# Patient Record
Sex: Female | Born: 1955 | Race: White | Hispanic: No | Marital: Married | Smoking: Never smoker
Health system: Southern US, Community
[De-identification: ages and names within clinical notes are randomized; demographics above are authoritative.]

## PROBLEM LIST (undated history)

## (undated) DIAGNOSIS — Z9889 Other specified postprocedural states: Secondary | ICD-10-CM

## (undated) DIAGNOSIS — T753XXA Motion sickness, initial encounter: Secondary | ICD-10-CM

## (undated) DIAGNOSIS — R112 Nausea with vomiting, unspecified: Secondary | ICD-10-CM

## (undated) DIAGNOSIS — F329 Major depressive disorder, single episode, unspecified: Secondary | ICD-10-CM

## (undated) DIAGNOSIS — M1990 Unspecified osteoarthritis, unspecified site: Secondary | ICD-10-CM

## (undated) DIAGNOSIS — I10 Essential (primary) hypertension: Secondary | ICD-10-CM

## (undated) DIAGNOSIS — K219 Gastro-esophageal reflux disease without esophagitis: Secondary | ICD-10-CM

## (undated) DIAGNOSIS — F32A Depression, unspecified: Secondary | ICD-10-CM

## (undated) DIAGNOSIS — K2270 Barrett's esophagus without dysplasia: Secondary | ICD-10-CM

## (undated) HISTORY — DX: Essential (primary) hypertension: I10

## (undated) HISTORY — DX: Depression, unspecified: F32.A

## (undated) HISTORY — DX: Major depressive disorder, single episode, unspecified: F32.9

## (undated) HISTORY — PX: FRACTURE SURGERY: SHX138

## (undated) HISTORY — PX: WISDOM TOOTH EXTRACTION: SHX21

## (undated) HISTORY — DX: Barrett's esophagus without dysplasia: K22.70

## (undated) HISTORY — PX: COLONOSCOPY: SHX174

## (undated) HISTORY — PX: BREAST BIOPSY: SHX20

## (undated) HISTORY — PX: FOOT SURGERY: SHX648

## (undated) HISTORY — PX: BREAST EXCISIONAL BIOPSY: SUR124

---

## 2005-04-28 ENCOUNTER — Ambulatory Visit: Payer: Self-pay | Admitting: Obstetrics and Gynecology

## 2005-07-17 ENCOUNTER — Emergency Department: Payer: Self-pay | Admitting: Emergency Medicine

## 2005-07-24 ENCOUNTER — Emergency Department: Payer: Self-pay | Admitting: Emergency Medicine

## 2007-11-22 ENCOUNTER — Ambulatory Visit: Payer: Self-pay | Admitting: Radiology

## 2007-11-23 ENCOUNTER — Ambulatory Visit: Payer: Self-pay | Admitting: Ophthalmology

## 2008-03-30 ENCOUNTER — Ambulatory Visit: Payer: Self-pay | Admitting: Occupational Medicine

## 2008-07-09 ENCOUNTER — Ambulatory Visit: Payer: Self-pay | Admitting: Unknown Physician Specialty

## 2011-06-10 ENCOUNTER — Ambulatory Visit: Payer: Self-pay | Admitting: Internal Medicine

## 2011-12-09 ENCOUNTER — Ambulatory Visit: Payer: Self-pay | Admitting: Obstetrics and Gynecology

## 2011-12-18 ENCOUNTER — Ambulatory Visit: Payer: Self-pay | Admitting: Obstetrics and Gynecology

## 2011-12-22 LAB — PATHOLOGY REPORT

## 2012-09-26 ENCOUNTER — Ambulatory Visit: Payer: Self-pay | Admitting: Gastroenterology

## 2012-09-27 LAB — PATHOLOGY REPORT

## 2013-05-02 ENCOUNTER — Ambulatory Visit: Payer: Self-pay | Admitting: Internal Medicine

## 2014-01-12 ENCOUNTER — Ambulatory Visit: Payer: Self-pay | Admitting: Gastroenterology

## 2014-01-17 LAB — PATHOLOGY REPORT

## 2014-09-11 NOTE — Op Note (Signed)
PATIENT NAME:  Andrea Livingston, Andrea Livingston MR#:  [id number] DATE OF BIRTH:  [birth year]  DATE OF PROCEDURE:  12/18/2011  PREOPERATIVE DIAGNOSIS: Postmenopausal bleeding.   POSTOPERATIVE DIAGNOSES:  1. Postmenopausal bleeding.  2. Endometrial polyp.   PROCEDURES:  1. Fractional dilation and curettage.  2. Resectoscopic removal of endometrial polyp.   SURGEON:  Beverly Gust. Martise Waddell, MD  ANESTHESIA: General endotracheal anesthesia.   INDICATIONS: A [age] year old gravida 3, para 3 patient with postmenopausal bleeding. The patient is with a thickened endometrial stripe on vaginal ultrasound.   PROCEDURE: After adequate general endotracheal anesthesia, the patient was placed in the dorsal supine position with the legs in the candy-cane stirrups. Lower abdomen, perineum, and vagina were prepped with Betadine and the patient was sterilely draped. Straight catheterization of the bladder yielded 100 mL urine. A weighted speculum was placed in the posterior vaginal vault and the a single-tooth tenaculum used to hold the anterior cervix.   Endocervical curettage was performed. Specimens sent to pathology. Uterus was sounded to 8 cm. Then the cervix was dilated to #18 Hanks dilator without difficulty. The hysteroscope was advanced with lactated Ringer's as the distending medium and was advanced into the endometrial cavity. Of note, there was a large, approximately 2 x 2-centimeter, anterior fundal, broad-based polyp. The hysteroscope was removed and the resectoscope was then assembled. The distending medium was switched to 1.5% glycine. The hysteroscope was advanced into the endometrial cavity and the endometrial polyp was dissected in fragments. Of note, there was a fluffy, thickened endometrial lining throughout the cavity. Intraoperative picture was taken. Good hemostasis was noted. Sharp curettage was then performed with additional fragments of tissue, again good hemostasis was noted. The patient tolerated the procedure  well. Silver nitrate applied to the anterior tenacula sites. Estimated blood loss minimal. Intraoperative fluids 1000 mL. Urine output 100 mL.  650 mL of 1.5% glycine went into the patient and 525 mL removed. Therefore, net 125 mL remained within the patient. The patient was taken to the recovery room in good condition.    ____________________________ Suzy Bouchardhomas J. Denishia Citro, MD tjs:bjt D: 12/18/2011 08:45:58 ET T: 12/18/2011 11:00:35 ET JOB#: [id number]  cc: Suzy Bouchardhomas J. Irvin Lizama, MD, <Dictator> Suzy BouchardHOMAS J Laurelin Elson MD ELECTRONICALLY SIGNED 12/20/2011 12:08

## 2014-11-15 ENCOUNTER — Other Ambulatory Visit: Payer: Self-pay | Admitting: Internal Medicine

## 2014-11-15 DIAGNOSIS — Z1231 Encounter for screening mammogram for malignant neoplasm of breast: Secondary | ICD-10-CM

## 2014-11-21 ENCOUNTER — Ambulatory Visit: Payer: Self-pay | Attending: Internal Medicine

## 2015-09-30 ENCOUNTER — Other Ambulatory Visit: Payer: Self-pay | Admitting: Internal Medicine

## 2015-09-30 DIAGNOSIS — Z1231 Encounter for screening mammogram for malignant neoplasm of breast: Secondary | ICD-10-CM

## 2015-10-24 ENCOUNTER — Ambulatory Visit
Admission: RE | Admit: 2015-10-24 | Discharge: 2015-10-24 | Disposition: A | Discharge status: Home / Self Care | Payer: 59 | Source: Ambulatory Visit | Attending: Internal Medicine | Admitting: Internal Medicine

## 2015-10-24 DIAGNOSIS — Z1231 Encounter for screening mammogram for malignant neoplasm of breast: Secondary | ICD-10-CM

## 2015-12-18 ENCOUNTER — Ambulatory Visit: Payer: 59 | Admitting: Physical Therapy

## 2015-12-19 ENCOUNTER — Ambulatory Visit: Payer: 59 | Attending: Obstetrics and Gynecology | Admitting: Physical Therapy

## 2015-12-19 ENCOUNTER — Encounter: Payer: Self-pay | Admitting: Physical Therapy

## 2015-12-19 DIAGNOSIS — M6281 Muscle weakness (generalized): Secondary | ICD-10-CM

## 2015-12-19 DIAGNOSIS — M791 Myalgia, unspecified site: Secondary | ICD-10-CM

## 2015-12-19 DIAGNOSIS — M5441 Lumbago with sciatica, right side: Secondary | ICD-10-CM

## 2015-12-19 DIAGNOSIS — M25561 Pain in right knee: Secondary | ICD-10-CM

## 2015-12-19 DIAGNOSIS — N393 Stress incontinence (female) (male): Secondary | ICD-10-CM | POA: Diagnosis present

## 2015-12-19 NOTE — Patient Instructions (Addendum)
    L sidebend 10 reps x 3  to decrease radiating pain

## 2015-12-20 NOTE — Therapy (Addendum)
[facility] MAIN [facility] SERVICES [address] [place], [place], [address] Phone: [phone]   Fax:  [phone]  Physical Therapy Evaluation  Patient Details  Name: Andrea Livingston MRN: [id number] Date of Birth: 1955/08/18 Referring Provider: Dalbert Garnet  Encounter Date: 12/19/2015    Past Medical History:  Diagnosis Date  . Barrett esophagus   . Depression   . Hypertension     Past Surgical History:  Procedure Laterality Date  . BREAST BIOPSY  20 years ago   unsure which side  . FOOT SURGERY Bilateral    as a child to help with pain when standing     There were no vitals filed for this visit.       Subjective Assessment - 12/25/15 2337    Subjective 1) SUI and urge incontinence occurs with laughing, coughing. Pt is a Engineer, site and urinates every 2 hrs but has urgency issues, especially when she sees the bathroom.  Changes pads 1x/ day.  Nocturia up to 3 x/ night.  Pt has had sleep study 2x  in 8-9 years ago with "inconclusive results which was aggravating" and she has not gone for another test. Pt 's fitbit says she gets 4-6 hours.   .  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       2) R knee pain started in 08/2015 which came on suddenly when she pivoted on her R foot.  Pt hobbled with a cane for 8 of the 12 weeks since the injury. Pt's pain has improved pain meds and a cortisone shot.  Hx of foot surgery as a child bilaterally.  Received a shot last week which decreased to pain to 4-8/10 to 1/10. Pt is undergoing PT at [facility]. Pt would like to walk and stand and stairclmbing     3) CLBP for 60 yo. Flare-up in June when she was assisting nurses to move her father while he was in the hospital. Pain radiates down the R leg to the back of her knee. 4/10.  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        Pertinent History 3 vaginal deliveries w/ episiotomies ,  physical activity:  swimming freestyle / aerobic walking in the pool 45 min 3-4x/ week . would like to ride bike but can not due to R knee    Patient Stated Goals "any improvement with her incontinence would please me"               [facility] PT Assessment - 12/25/15 2156      Assessment   Medical Diagnosis pelvic pain      Coordination   Gross Motor Movements are Fluid and Coordinated --  abdominal straining. excessive effort with inhalation   Fine Motor Movements are Fluid and Coordinated --  pelvic floor lengthening./ contraction achieved with trainin     Single Leg Stance   Comments R 3 sec, L 10 sec     Sit to Stand   Comments narrow BOS, breathholding     Other:   Other/ Comments lifting with forward bending , poor form     AROM   Overall AROM Comments radiating R LE w/ R sidebend ( centralization with repeated movement L sidebend 10 x 3 )      Strength   Overall Strength Comments hip abd 3/5 bilaterally     Palpation   Palpation comment  restrictions of scars a B medial aspect of feet     Bed Mobility   Bed Mobility --  half crunch                   [facility] Adult PT Treatment/Exercise - 12/25/15 2149      Therapeutic Activites    Therapeutic Activities --  see pt instructions     Exercises   Exercises --  see pt instructions                PT Education - 12/25/15 2151    Education provided Yes   Education Details POC, anatomy/physiology, goals, HEP   Person(s) Educated Patient   Methods Explanation;Demonstration;Tactile cues;Verbal cues;Handout   Comprehension Returned demonstration;Verbalized understanding             PT Long Term Goals - 12/25/15 2159      PT LONG TERM GOAL #1   Title Pt will decrease nocturia epsiodes from 3x/ night to < 2x night in order to improve sleep quality   Time 12   Period Weeks   Status New     PT LONG TERM GOAL #2   Title Pt will demo proper coordination with pelvic floor activation with  simulated cough in order to decrease SUI   Time 12   Period Weeks   Status New     PT LONG TERM GOAL #3   Title Pt will report no radiating RLE pain across 2 weeks in order to bend and lift objects.    Time 12   Period Weeks   Status New     PT LONG TERM GOAL #4   Title Pt will demo increased hip abduction strength from 3/5 to 4/5 bilaterally in order to minimize knee pain and to climb stairs.   Time 12   Period Weeks   Status New               Plan - 12/25/15 2152    Clinical Impression Statement Pt is a 60 yo female who c/o SUI, urge incontinence, R knee pain, CLBP w/ R radiating pain. These deficits impact her ADLs, work, and QOL. Pt 's clinical presentations include increased mm tensions, restricted spinal mobility,hip weakness, poor body mechanics, poor balance, and dyscoordination/ strength of deep core mm. After Tx today, pt's RLE radiating pain was centralized and pt demo'd proper body mechanics with toileting and lifting. Pt would benefit from a more updated sleep study to address nocturia.   Patient will benefit from skilled therapeutic intervention in order to improve the following deficits and impairments:  Decreased coordination, Decreased mobility, Decreased scar mobility, Increased muscle spasms, Postural dysfunction, Improper body mechanics, Pain, Decreased endurance, Decreased range of motion, Decreased activity tolerance, Decreased balance, Decreased safety awareness, Decreased strength, Hypomobility, Impaired flexibility, Difficulty walking, Abnormal gait        Rehab Potential Good   PT Frequency 1x / week   PT Duration 12 weeks   PT Treatment/Interventions ADLs/Self Care Home Management;Aquatic Therapy;Electrical Stimulation;Moist Heat;Traction;Gait training;Stair training;Functional mobility training;Therapeutic activities;Therapeutic exercise;Balance training;Patient/family education;Neuromuscular re-education;Manual techniques;Manual lymph drainage;Scar  mobilization;Taping;Splinting;Energy conservation   Consulted and Agree with Plan of Care Patient      Patient will benefit from skilled therapeutic intervention in order to improve the following deficits and impairments:  Decreased coordination, Decreased mobility, Decreased scar mobility, Increased muscle spasms, Postural dysfunction, Improper body mechanics, Pain, Decreased endurance, Decreased range of motion, Decreased activity tolerance, Decreased balance, Decreased safety awareness, Decreased strength, Hypomobility, Impaired  flexibility, Difficulty walking, Abnormal gait  Visit Diagnosis: Myalgia - Plan: PT plan of care cert/re-cert  Stress incontinence (female) (female) - Plan: PT plan of care cert/re-cert  Pain in right knee - Plan: PT plan of care cert/re-cert  Lumbago with sciatica, right side - Plan: PT plan of care cert/re-cert  Muscle weakness (generalized) - Plan: PT plan of care cert/re-cert     Problem List There are no active problems to display for this patient.   Mariane Masters ,PT, DPT, E-RYT  12/25/2015, 11:37 PM  [facility] MAIN [facility] SERVICES [address] [place], [place], [address] Phone: [phone]   Fax:  [phone]  Name: ARMONII SIEH MRN: [id number] Date of Birth: 1955-11-05

## 2015-12-25 ENCOUNTER — Encounter: Payer: 59 | Admitting: Physical Therapy

## 2015-12-25 NOTE — Addendum Note (Signed)
Addended by: Mariane Masters on: 12/25/2015 10:07 PM   Modules accepted: Orders

## 2015-12-26 ENCOUNTER — Ambulatory Visit: Payer: 59 | Admitting: Physical Therapy

## 2015-12-27 ENCOUNTER — Ambulatory Visit: Payer: 59 | Attending: Obstetrics and Gynecology | Admitting: Physical Therapy

## 2015-12-27 DIAGNOSIS — N393 Stress incontinence (female) (male): Secondary | ICD-10-CM | POA: Insufficient documentation

## 2015-12-27 DIAGNOSIS — M5441 Lumbago with sciatica, right side: Secondary | ICD-10-CM | POA: Diagnosis present

## 2015-12-27 DIAGNOSIS — M25561 Pain in right knee: Secondary | ICD-10-CM

## 2015-12-27 DIAGNOSIS — M6281 Muscle weakness (generalized): Secondary | ICD-10-CM | POA: Insufficient documentation

## 2015-12-27 DIAGNOSIS — M791 Myalgia, unspecified site: Secondary | ICD-10-CM

## 2015-12-27 NOTE — Patient Instructions (Addendum)
1. Open book (handout)   2. A. Finding pelvic neutral laying down and also when standing.      B.  Feet under knees with sitting, and weight shift to ballmounds and heels when standing: "zip the zipper"   You are now ready to begin training the deep core muscles system: diaphragm, transverse abdominis, pelvic floor . These muscles must work together as a team.       The key to these exercises to train the brain to coordinate the timing of these muscles and to have them turn on for long periods of time to hold you upright against gravity (especially important if you are on your feet all day).These muscles are postural muscles and play a role stabilizing your spine and bodyweight. By doing these repetitions slowly and correctly instead of doing crunches, you will achieve a flatter belly without a lower pooch. You are also placing your spine in a more neutral position and breathing properly which in turn, decreases your risk for problems related to your pelvic floor, abdominal, and low back such as pelvic organ prolapse, hernias, diastasis recti (separation of superficial muscles), disk herniations, spinal fractures. These exercises set a solid foundation for you to later progress to resistance/ strength training with therabands and weights and return to other typical fitness exercises with a stronger deeper core.   You are now ready to begin training the deep core muscles system: diaphragm, transverse abdominis, pelvic floor . These muscles must work together as a team.      The key to these exercises to train the brain to coordinate the timing of these muscles and to have them turn on for long periods of time to hold you upright against gravity (especially important if you are on your feet all day).These muscles are postural muscles and play a role stabilizing your spine and bodyweight. By doing these repetitions slowly and correctly instead of doing crunches, you will achieve a flatter belly  without a lower pooch. You are also placing your spine in a more neutral position and breathing properly which in turn, decreases your risk for problems related to your pelvic floor, abdominal, and low back such as pelvic organ prolapse, hernias, diastasis recti (separation of superficial muscles), disk herniations, spinal fractures. These exercises set a solid foundation for you to later progress to resistance/ strength training with therabands and weights and return to other typical fitness exercises with a stronger deeper core.   Level 1 this week                    DECREASE DOWNWARD PRESSURE ON  YOUR PELVIC FLOOR, ABDOMINAL, LOW BACK MUSCLES       PRESERVE YOUR PELVIC HEALTH LONG-TERM   ** SQUEEZE pelvic floor BEFORE YOUR SNEEZE, COUGH, LAUGH    5 reps each day   ** EXHALE BEFORE YOU RISE AGAINST GRAVITY (lifting, sit to stand, from squat to stand)   ** LOG ROLL OUT OF BED INSTEAD OF CRUNCH/SIT-UP

## 2015-12-27 NOTE — Therapy (Signed)
[facility] MAIN [facility] SERVICES [address] [place], [place], [address] Phone: [phone]   Fax:  [phone]  Physical Therapy Treatment  Patient Details  Name: Andrea Livingston MRN: [id number] Date of Birth: [birth year] Referring Provider: Dalbert Garnet  Encounter Date: 12/27/2015      PT End of Session - 12/27/15 1110    Visit Number 2   Number of Visits 12   PT Start Time 0900   PT Stop Time 0955   PT Time Calculation (min) 55 min   Activity Tolerance Patient tolerated treatment well;No increased pain   Behavior During Therapy WFL for tasks assessed/performed      Past Medical History:  Diagnosis Date  . Barrett esophagus   . Depression   . Hypertension     Past Surgical History:  Procedure Laterality Date  . BREAST BIOPSY  20 years ago   unsure which side  . FOOT SURGERY Bilateral    as a child to help with pain when standing     There were no vitals filed for this visit.      Subjective Assessment - 12/27/15 0859    Subjective Pt reports she felt encouraged after last session. Pt recorrected her posture when she felt back pain and then she felt better. "It felt amazing with such little change in posture."    Pertinent History 3 vaginal deliveries w/ episiotomies , physical activity:  swimming freestyle / aerobic walking in the pool 45 min 3-4x/ week . would like to ride bike but can not due to R knee    Patient Stated Goals "any improvement with her incontinence would please me"               [facility] PT Assessment - 12/27/15 1005      Palpation   Spinal mobility increased mm tensions at midback R >  L                   Pelvic Floor Special Questions - 12/27/15 0906    Pelvic Floor Internal Exam pt consented verbally without contraindications    Exam Type Vaginal   Palpation no mm tensions   Strength good squeeze, good lift, able to hold against strong resistance  posterior> anterior   Strength # of  reps --  worked on anterior activation and squeeze with cough coordin           [facility] Adult PT Treatment/Exercise - 12/27/15 1107      Therapeutic Activites    Therapeutic Activities --  see pt instructions: standing / seated posture     Neuro Re-ed    Neuro Re-ed Details  see pt instructions     Moist Heat Therapy   Number Minutes Moist Heat 5 Minutes   Moist Heat Location --  midback     Manual Therapy   Manual therapy comments thiele mm on anterior pelvic floor mm internally, Grade III along thoracic segments T3-10 and STM along paraspinals with MWM                PT Education - 12/27/15 1110    Education provided Yes   Education Details HEP   Person(s) Educated Patient   Methods Explanation;Demonstration;Tactile cues;Verbal cues;Handout   Comprehension Returned demonstration;Verbalized understanding             PT Long Term Goals - 12/25/15 2159      PT LONG TERM GOAL #1   Title Pt will decrease nocturia epsiodes  from 3x/ night to < 2x night in order to improve sleep quality   Time 12   Period Weeks   Status New     PT LONG TERM GOAL #2   Title Pt will demo proper coordination with pelvic floor activation with simulated cough in order to decrease SUI   Time 12   Period Weeks   Status New     PT LONG TERM GOAL #3   Title Pt will report no radiating RLE pain across 2 weeks in order to bend and lift objects.    Time 12   Period Weeks   Status New     PT LONG TERM GOAL #4   Title Pt will demo increased hip abduction strength from 3/5 to 4/5 bilaterally in order to minimize knee pain and to climb stairs.   Time 12   Period Weeks   Status New               Plan - 12/27/15 1111    Clinical Impression Statement Pt showed limited pelvic floor ROM but this deficit was corrected with manual Tx at thorax and internal manual Tx at anterior pelvic floor mm. Post-Tx, pt showed decreased midback tensions, coordinated movement of pelvic floor mm  with breathing and with coughing, and proper alignment for pelvic neutral. Pt reported decreased LBP with ability to find pelvic neutral in all positions. Pt will continue to benefit from skilled PT.    Rehab Potential Good   PT Frequency 1x / week   PT Duration 12 weeks   PT Treatment/Interventions ADLs/Self Care Home Management;Aquatic Therapy;Electrical Stimulation;Moist Heat;Traction;Gait training;Stair training;Functional mobility training;Therapeutic activities;Therapeutic exercise;Balance training;Patient/family education;Neuromuscular re-education;Manual techniques;Manual lymph drainage;Scar mobilization;Taping;Splinting;Energy conservation   Consulted and Agree with Plan of Care Patient      Patient will benefit from skilled therapeutic intervention in order to improve the following deficits and impairments:  Decreased coordination, Decreased mobility, Decreased scar mobility, Increased muscle spasms, Postural dysfunction, Improper body mechanics, Pain, Decreased endurance, Decreased range of motion, Decreased activity tolerance, Decreased balance, Decreased safety awareness, Decreased strength, Hypomobility, Impaired flexibility, Difficulty walking, Abnormal gait  Visit Diagnosis: Myalgia  Stress incontinence (female) (female)  Pain in right knee  Lumbago with sciatica, right side  Muscle weakness (generalized)     Problem List There are no active problems to display for this patient.   Mariane Masters ,PT, DPT, E-RYT  12/27/2015, 11:14 AM   [facility] MAIN [facility] SERVICES [address] [place], [place], [address] Phone: [phone]   Fax:  [phone]  Name: Andrea Livingston MRN: [id number] Date of Birth: 24-Nov-1955

## 2015-12-31 ENCOUNTER — Encounter: Payer: 59 | Admitting: Physical Therapy

## 2015-12-31 ENCOUNTER — Ambulatory Visit: Payer: 59 | Admitting: Physical Therapy

## 2015-12-31 DIAGNOSIS — M791 Myalgia, unspecified site: Secondary | ICD-10-CM

## 2015-12-31 DIAGNOSIS — M25561 Pain in right knee: Secondary | ICD-10-CM

## 2015-12-31 DIAGNOSIS — M5441 Lumbago with sciatica, right side: Secondary | ICD-10-CM

## 2015-12-31 DIAGNOSIS — M6281 Muscle weakness (generalized): Secondary | ICD-10-CM

## 2015-12-31 DIAGNOSIS — N393 Stress incontinence (female) (male): Secondary | ICD-10-CM

## 2015-12-31 NOTE — Therapy (Signed)
[facility] MAIN [facility] SERVICES [address] [place], [place], [address] Phone: [phone]   Fax:  [phone]  Physical Therapy Treatment  Patient Details  Name: Andrea Livingston MRN: [id number] Date of Birth: November 09, 1955 Referring Provider: Dalbert Garnet  Encounter Date: 12/31/2015      PT End of Session - 12/31/15 1542    Visit Number 3   Number of Visits 12   PT Start Time 1532   PT Stop Time 1604   PT Time Calculation (min) 32 min   Activity Tolerance Patient tolerated treatment well;No increased pain   Behavior During Therapy WFL for tasks assessed/performed      Past Medical History:  Diagnosis Date  . Barrett esophagus   . Depression   . Hypertension     Past Surgical History:  Procedure Laterality Date  . BREAST BIOPSY  20 years ago   unsure which side  . FOOT SURGERY Bilateral    as a child to help with pain when standing     There were no vitals filed for this visit.      Subjective Assessment - 12/31/15 1558    Subjective Pt practiced the open book exercise. Pt felt confused about the standing posture cuing from last session.    Pertinent History 3 vaginal deliveries w/ episiotomies , physical activity:  swimming freestyle / aerobic walking in the pool 45 min 3-4x/ week . would like to ride bike but can not due to R knee    Patient Stated Goals "any improvement with her incontinence would please me"                         Pelvic Floor Special Questions - 12/31/15 1553    Pelvic Floor Internal Exam external through clothing   Strength good squeeze, good lift, able to hold agaisnt strong resistance   Strength # of reps 5   Strength # of seconds 3     Quick contractions 5 reps       [facility] Adult PT Treatment/Exercise - 12/31/15 1601      Therapeutic Activites    Therapeutic Activities --  see pt instruction     Neuro Re-ed    Neuro Re-ed Details  see pt instructions                  PT Education - 12/31/15 1542    Education provided Yes   Education Details HEP   Person(s) Educated Patient   Methods Explanation;Demonstration;Tactile cues;Verbal cues;Handout   Comprehension Returned demonstration;Verbalized understanding;Verbal cues required;Tactile cues required             PT Long Term Goals - 12/25/15 2159      PT LONG TERM GOAL #1   Title Pt will decrease nocturia epsiodes from 3x/ night to < 2x night in order to improve sleep quality   Time 12   Period Weeks   Status New     PT LONG TERM GOAL #2   Title Pt will demo proper coordination with pelvic floor activation with simulated cough in order to decrease SUI   Time 12   Period Weeks   Status New     PT LONG TERM GOAL #3   Title Pt will report no radiating RLE pain across 2 weeks in order to bend and lift objects.    Time 12   Period Weeks   Status New     PT LONG TERM GOAL #  4   Title Pt will demo increased hip abduction strength from 3/5 to 4/5 bilaterally in order to minimize knee pain and to climb stairs.   Time 12   Period Weeks   Status New               Plan - 12/31/15 1543    Clinical Impression Statement Pt progressed well with her deep core strengthening and pelvic floor exercises. PT called pt's PCP Dr. Graciela HusbandsKlein and left a message re: pt's out of date sleep study and pt may benefit from an updated sleep study given her nocturia Sx. 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 Also addressed standing variations with proper engagement of core mm and relaxed mm.   Pt will continue to benefit from skilled PT .   Rehab Potential Good   PT Frequency 1x / week   PT Duration 12 weeks   PT Treatment/Interventions ADLs/Self Care Home Management;Aquatic Therapy;Electrical Stimulation;Moist Heat;Traction;Gait  training;Stair training;Functional mobility training;Therapeutic activities;Therapeutic exercise;Balance training;Patient/family education;Neuromuscular re-education;Manual techniques;Manual lymph drainage;Scar mobilization;Taping;Splinting;Energy conservation   Consulted and Agree with Plan of Care Patient      Patient will benefit from skilled therapeutic intervention in order to improve the following deficits and impairments:  Decreased coordination, Decreased mobility, Decreased scar mobility, Increased muscle spasms, Postural dysfunction, Improper body mechanics, Pain, Decreased endurance, Decreased range of motion, Decreased activity tolerance, Decreased balance, Decreased safety awareness, Decreased strength, Hypomobility, Impaired flexibility, Difficulty walking, Abnormal gait  Visit Diagnosis: Myalgia  Stress incontinence (female) (female)  Pain in right knee  Lumbago with sciatica, right side  Muscle weakness (generalized)     Problem List There are no active problems to display for this patient.   Mariane MastersYeung,Shin Yiing ,PT, DPT, E-RYT  12/31/2015, 4:06 PM  [facility] [address] [place], [place], [address] Phone: [phone]   Fax:  [phone]  Name: Andrea Livingston MRN: [id number] Date of Birth: [birth year]

## 2015-12-31 NOTE — Patient Instructions (Addendum)
PELVIC FLOOR / KEGEL EXERCISES   Pelvic floor/ Kegel exercises are used to strengthen the muscles in the base of your pelvis that are responsible for supporting your pelvic organs and preventing urine/feces leakage. Based on your therapist's recommendations, they can be performed while standing, sitting, or lying down.  Make yourself aware of this muscle group by using these cues:  Imagine you are in a crowded room and you feel the need to pass gas. Your response is to pull up and in at the rectum.  Close the rectum. Pull the muscles up inside your body,feeling your scrotum lifting as well . Feel the pelvic floor muscles lift as if you were walking into a cold lake.  Place your hand on top of your pubic bone. Tighten and draw in the muscles around the anal muscles without squeezing the buttock muscles.  Common Errors:  Breath holding: If you are holding your breath, you may be bearing down against your bladder instead of pulling it up. If you belly bulges up while you are squeezing, you are holding your breath. Be sure to breathe gently in and out while exercising. Counting out loud may help you avoid holding your breath.  Accessory muscle use: You should not see or feel other muscle movement when performing pelvic floor exercises. When done properly, no one can tell that you are performing the exercises. Keep the buttocks, belly and inner thighs relaxed.  Overdoing it: Your muscles can fatigue and stop working for you if you over-exercise. You may actually leak more or feel soreness at the lower abdomen or rectum.  YOUR HOME EXERCISE PROGRAM  LONG HOLDS: Position: on back  Inhale and then exhale. Then squeeze the muscle and count aloud for 3 seconds. Rest with three long breaths. (Be sure to let belly sink in with exhales and not push outward)  Perform 5 repetitions, 3 times/day  SHORT HOLDS: Position: on back, sitting   Inhale and then exhale. Then squeeze the muscle.  (Be sure to  let belly sink in with exhales and not push outward)  Perform 5 repetitions, 5  Times/day  **ALSO SQUEEZE BEFORE YOUR SNEEZE, COUGH, LAUGH to decrease downward pressure   **ALSO EXHALE BEFORE YOU RISE AGAINST GRAVITY (lifting, sit to stand, from squat to stand)    You are now ready to begin training the deep core muscles system: diaphragm, transverse abdominis, pelvic floor . These muscles must work together as a team.           The key to these exercises to train the brain to coordinate the timing of these muscles and to have them turn on for long periods of time to hold you upright against gravity (especially important if you are on your feet all day).These muscles are postural muscles and play a role stabilizing your spine and bodyweight. By doing these repetitions slowly and correctly instead of doing crunches, you will achieve a flatter belly without a lower pooch. You are also placing your spine in a more neutral position and breathing properly which in turn, decreases your risk for problems related to your pelvic floor, abdominal, and low back such as pelvic organ prolapse, hernias, diastasis recti (separation of superficial muscles), disk herniations, spinal fractures. These exercises set a solid foundation for you to later progress to resistance/ strength training with therabands and weights and return to other typical fitness exercises with a stronger deeper core.  DO level 2 (30 reps in morning and 30 reps at night)

## 2016-01-03 ENCOUNTER — Ambulatory Visit: Payer: 59 | Admitting: Physical Therapy

## 2016-01-03 DIAGNOSIS — M791 Myalgia, unspecified site: Secondary | ICD-10-CM

## 2016-01-03 DIAGNOSIS — M5441 Lumbago with sciatica, right side: Secondary | ICD-10-CM

## 2016-01-03 DIAGNOSIS — M25561 Pain in right knee: Secondary | ICD-10-CM

## 2016-01-03 DIAGNOSIS — M6281 Muscle weakness (generalized): Secondary | ICD-10-CM

## 2016-01-03 DIAGNOSIS — N393 Stress incontinence (female) (male): Secondary | ICD-10-CM

## 2016-01-03 NOTE — Patient Instructions (Addendum)
PELVIC FLOOR / KEGEL EXERCISES   Pelvic floor/ Kegel exercises are used to strengthen the muscles in the base of your pelvis that are responsible for supporting your pelvic organs and preventing urine/feces leakage. Based on your therapist's recommendations, they can be performed while standing, sitting, or lying down.  Make yourself aware of this muscle group by using these cues:  Imagine you are in a crowded room and you feel the need to pass gas. Your response is to pull up and in at the rectum.  Close the rectum. Pull the muscles up inside your body,feeling your scrotum lifting as well . Feel the pelvic floor muscles lift as if you were walking into a cold lake.  Place your hand on top of your pubic bone. Tighten and draw in the muscles around the anal muscles without squeezing the buttock muscles.  Common Errors:  Breath holding: If you are holding your breath, you may be bearing down against your bladder instead of pulling it up. If you belly bulges up while you are squeezing, you are holding your breath. Be sure to breathe gently in and out while exercising. Counting out loud may help you avoid holding your breath.  Accessory muscle use: You should not see or feel other muscle movement when performing pelvic floor exercises. When done properly, no one can tell that you are performing the exercises. Keep the buttocks, belly and inner thighs relaxed.  Overdoing it: Your muscles can fatigue and stop working for you if you over-exercise. You may actually leak more or feel soreness at the lower abdomen or rectum.  YOUR HOME EXERCISE PROGRAM  LONG HOLDS: Position: on back  Inhale and then exhale. Then squeeze the muscle and count aloud for 4 seconds. Rest with three long breaths. (Be sure to let belly sink in with exhales and not push outward)  Perform 4 repetitions, 4 times/day  SHORT HOLDS: Position: on sitting or standing  Inhale and then exhale. Then squeeze the muscle.  (Be sure  to let belly sink in with exhales and not push outward)  Perform 5 repetitions, 5  Times/day  **ALSO SQUEEZE BEFORE YOUR SNEEZE, COUGH, LAUGH to decrease downward pressure   **ALSO EXHALE BEFORE YOU RISE AGAINST GRAVITY (lifting, sit to stand, from squat to stand)   __________  Lat pull downs with yellow band  10 reps with R leg forward,  10 reps with L leg forward  Feet hip width apart   Inhale, lengthen spine, chin tuck, exhale shoulders roll back, pull band   STRETCH  eagle pose:  2 -3 4reps   Crossing elbow over other, mini squat  5 breaths , feel expansion into back ribs   Doorknob stretch   ________   Functional activity:  Practice not gasping for air for inhale when speaking to class, think inhale and smell soup for ribcage expansion   Moving furniture:  Wide stance, semi tandem stance and exhale against resistance in pulling or pushing

## 2016-01-03 NOTE — Therapy (Signed)
[place] [facility] MAIN [facility] SERVICES [address] [place], [place], [address] Phone: [phone]   Fax:  [phone]  Physical Therapy Treatment  Patient Details  Name: FLORENE BRILL MRN: [id number] Date of Birth: 03/19/1956 Referring Provider: Dalbert Garnet  Encounter Date: 01/03/2016      PT End of Session - 01/03/16 0945    Visit Number 4   Number of Visits 12   PT Start Time 0902   PT Stop Time 0950   PT Time Calculation (min) 48 min   Activity Tolerance Patient tolerated treatment well;No increased pain   Behavior During Therapy WFL for tasks assessed/performed      Past Medical History:  Diagnosis Date  . Barrett esophagus   . Depression   . Hypertension     Past Surgical History:  Procedure Laterality Date  . BREAST BIOPSY  20 years ago   unsure which side  . FOOT SURGERY Bilateral    as a child to help with pain when standing     There were no vitals filed for this visit.      Subjective Assessment - 01/03/16 0938    Subjective Pt expressed leakage when moving furniture at school   Pertinent History 3 vaginal deliveries w/ episiotomies , physical activity:  swimming freestyle / aerobic walking in the pool 45 min 3-4x/ week . would like to ride bike but can not due to R knee    Patient Stated Goals "any improvement with her incontinence would please me"                         Pelvic Floor Special Questions - 01/03/16 0959    Pelvic Floor Internal Exam external through clothing   Strength good squeeze, good lift, able to hold agaisnt strong resistance   Strength # of reps --  4   Strength # of seconds 4           [facility] Adult PT Treatment/Exercise - 01/03/16 0958      Neuro Re-ed    Neuro Re-ed Details  see pt instructions and education on deep core coordination with anatomy images                PT Education - 01/03/16 0945    Education provided Yes   Education Details HEP   Person(s) Educated Patient   Methods Explanation;Demonstration;Tactile cues;Verbal cues;Handout   Comprehension Returned demonstration;Verbalized understanding             PT Long Term Goals - 12/25/15 2159      PT LONG TERM GOAL #1   Title Pt will decrease nocturia epsiodes from 3x/ night to < 2x night in order to improve sleep quality   Time 12   Period Weeks   Status New     PT LONG TERM GOAL #2   Title Pt will demo proper coordination with pelvic floor activation with simulated cough in order to decrease SUI   Time 12   Period Weeks   Status New     PT LONG TERM GOAL #3   Title Pt will report no radiating RLE pain across 2 weeks in order to bend and lift objects.    Time 12   Period Weeks   Status New     PT LONG TERM GOAL #4   Title Pt will demo increased hip abduction strength from 3/5 to 4/5 bilaterally in order to minimize knee pain and to climb  stairs.   Time 12   Period Weeks   Status New               Plan - 01/03/16 0946    Clinical Impression Statement Pt demo'd awareness of activating her 3rd layer of pelvic floor muscles with increased endurance of pelvic floor activation.  She progressed to upright quick activation of pelvic floor. Initiated thoracolumbar activation resistance band exercise with tactil and verbal cues for decreased upper trap overuse.  Educated on deep core activation with moving furniture and speaking to her class.  Pt will continue to benefit from skilled PT.    Rehab Potential Good   PT Frequency 1x / week   PT Duration 12 weeks   PT Treatment/Interventions ADLs/Self Care Home Management;Aquatic Therapy;Electrical Stimulation;Moist Heat;Traction;Gait training;Stair training;Functional mobility training;Therapeutic activities;Therapeutic exercise;Balance training;Patient/family education;Neuromuscular re-education;Manual techniques;Manual lymph drainage;Scar mobilization;Taping;Splinting;Energy conservation   Consulted and Agree  with Plan of Care Patient      Patient will benefit from skilled therapeutic intervention in order to improve the following deficits and impairments:  Decreased coordination, Decreased mobility, Decreased scar mobility, Increased muscle spasms, Postural dysfunction, Improper body mechanics, Pain, Decreased endurance, Decreased range of motion, Decreased activity tolerance, Decreased balance, Decreased safety awareness, Decreased strength, Hypomobility, Impaired flexibility, Difficulty walking, Abnormal gait  Visit Diagnosis: Myalgia  Stress incontinence (female) (female)  Pain in right knee  Lumbago with sciatica, right side  Muscle weakness (generalized)     Problem List There are no active problems to display for this patient.   Mariane MastersYeung,Shin Yiing ,PT, DPT, E-RYT  01/03/2016, 10:00 AM  [place] [facility] [address] [place], [place], [address] Phone: [phone]   Fax:  [phone]  Name: Bryn GullingVictoria R Burdick MRN: [id number] Date of Birth: 1956/03/31

## 2016-01-07 ENCOUNTER — Encounter: Payer: 59 | Admitting: Physical Therapy

## 2016-01-09 ENCOUNTER — Ambulatory Visit: Payer: 59 | Admitting: Physical Therapy

## 2016-01-10 ENCOUNTER — Ambulatory Visit: Payer: 59 | Admitting: Physical Therapy

## 2016-01-10 DIAGNOSIS — M6281 Muscle weakness (generalized): Secondary | ICD-10-CM

## 2016-01-10 DIAGNOSIS — M5441 Lumbago with sciatica, right side: Secondary | ICD-10-CM

## 2016-01-10 DIAGNOSIS — M791 Myalgia, unspecified site: Secondary | ICD-10-CM

## 2016-01-10 DIAGNOSIS — N393 Stress incontinence (female) (male): Secondary | ICD-10-CM

## 2016-01-10 DIAGNOSIS — M25561 Pain in right knee: Secondary | ICD-10-CM

## 2016-01-10 NOTE — Patient Instructions (Signed)
Heel raises, slow and controlled, maintaining transverse arch contact  10 reps x 3    Self- feet massage as demonstrated    Progressing pelvic floor endurance from 4 sec, 3 reps to 4 sec, 4 reps, 4 x day.  Be consistent in counting and systematic with sets rather than "just when I think about it".

## 2016-01-10 NOTE — Therapy (Signed)
[place] [facility] [address] [place], [place], [address] Phone: [phone]   Fax:  [phone]  Physical Therapy Treatment  Patient Details  Name: Andrea Livingston MRN: [id number] Date of Birth: 09/16/1955 Referring Provider: Dalbert GarnetBeasley  Encounter Date: 01/10/2016      PT End of Session - 01/10/16 2326    Visit Number 5   Number of Visits 12   PT Start Time 0910   PT Stop Time 1005   PT Time Calculation (min) 55 min   Activity Tolerance Patient tolerated treatment well;No increased pain   Behavior During Therapy WFL for tasks assessed/performed      Past Medical History:  Diagnosis Date  . Barrett esophagus   . Depression   . Hypertension     Past Surgical History:  Procedure Laterality Date  . BREAST BIOPSY  20 years ago   unsure which side  . FOOT SURGERY Bilateral    as a child to help with pain when standing     There were no vitals filed for this visit.      Subjective Assessment - 01/10/16 0913    Subjective Pt reported she is so excited about her improvement with getting up one time a night instead of two times a night. Pt has had two night this past week where she slept uninterrupted by trips to the bathroom   Pertinent History 3 vaginal deliveries w/ episiotomies , physical activity:  swimming freestyle / aerobic walking in the pool 45 min 3-4x/ week . would like to ride bike but can not due to R knee    Patient Stated Goals "any improvement with her incontinence would please me"               [facility] Assessment - 01/10/16 2322      Posture/Postural Control   Posture Comments pronation, low arch in B feet, genu valgus     Palpation   Palpation comment increased feet scar resrictions along medial arch                     [facility] Adult PT Treatment/Exercise - 01/10/16 2322      Neuro Re-ed    Neuro Re-ed Details  correcting pronation in standing, educated the biomechanics of  feet to pelvis, slight supination to activate pelvic floor mm, decrease genu valgus  self-feet massage, heel raises 10 reps w/ transverse arch contacting the ground on rise and descent of heel     Manual Therapy   Manual therapy comments foot scar massage, Grade I-II mobs along rays I-V of feet B                  PT Education - 01/10/16 2325    Education provided Yes   Education Details HEP   Person(s) Educated Patient   Methods Explanation;Demonstration;Tactile cues;Verbal cues;Handout   Comprehension Returned demonstration;Verbalized understanding             PT Long Term Goals - 01/10/16 0915      PT LONG TERM GOAL #1   Title Pt will decrease nocturia epsiodes from 3x/ night to < 2x night in order to improve sleep quality   Time 12   Period Weeks   Status Achieved     PT LONG TERM GOAL #2   Title Pt will demo proper coordination with pelvic floor activation with simulated cough in order to decrease SUI   Time 12   Period  Weeks   Status Achieved     PT LONG TERM GOAL #3   Title Pt will report decreased radiating RLE pain by 50%  across 2 weeks in order to bend and lift objects.    Time 12   Period Weeks   Status Achieved     PT LONG TERM GOAL #4   Title Pt will demo increased hip abduction strength from 3/5 to 4/5 bilaterally in order to minimize knee pain and to climb stairs.   Time 12   Period Weeks   Status New               Plan - 01/10/16 2326    Clinical Impression Statement Pt is progressing well as she reported decreased nocturia episodes and improved uninterrupted sleep. Addressed pt's feet scar restrictions, pronation, low arches, genu valgus, decreased pelvic floor activation in standing posture. Following Tx,  pt showed decreased scar restrictions along medial arch of her feet, improved supination, less genu valgus, and glut activation in stance. Anticipate pt 's pelvic floor health will be optimized by her awareness of proper activation  along the lower kinetic chain.  Pt is progressing well towards her goals.    Rehab Potential Good   PT Frequency 1x / week   PT Duration 12 weeks   PT Treatment/Interventions ADLs/Self Care Home Management;Aquatic Therapy;Electrical Stimulation;Moist Heat;Traction;Gait training;Stair training;Functional mobility training;Therapeutic activities;Therapeutic exercise;Balance training;Patient/family education;Neuromuscular re-education;Manual techniques;Manual lymph drainage;Scar mobilization;Taping;Splinting;Energy conservation   Consulted and Agree with Plan of Care Patient      Patient will benefit from skilled therapeutic intervention in order to improve the following deficits and impairments:  Decreased coordination, Decreased mobility, Decreased scar mobility, Increased muscle spasms, Postural dysfunction, Improper body mechanics, Pain, Decreased endurance, Decreased range of motion, Decreased activity tolerance, Decreased balance, Decreased safety awareness, Decreased strength, Hypomobility, Impaired flexibility, Difficulty walking, Abnormal gait  Visit Diagnosis: Myalgia  Stress incontinence (female) (female)  Pain in right knee  Lumbago with sciatica, right side  Muscle weakness (generalized)     Problem List There are no active problems to display for this patient.   Mariane MastersYeung,Shin Yiing ,PT, DPT, E-RYT  01/10/2016, 11:29 PM   [facility] [address] [place], [place], [address] Phone: [phone]   Fax:  [phone]  Name: Andrea Livingston MRN: [id number] Date of Birth: 12-30-1955

## 2016-01-16 ENCOUNTER — Ambulatory Visit: Payer: 59 | Admitting: Physical Therapy

## 2016-01-16 DIAGNOSIS — M791 Myalgia, unspecified site: Secondary | ICD-10-CM

## 2016-01-16 DIAGNOSIS — M5441 Lumbago with sciatica, right side: Secondary | ICD-10-CM

## 2016-01-16 DIAGNOSIS — M25561 Pain in right knee: Secondary | ICD-10-CM

## 2016-01-16 DIAGNOSIS — N393 Stress incontinence (female) (male): Secondary | ICD-10-CM

## 2016-01-16 DIAGNOSIS — M6281 Muscle weakness (generalized): Secondary | ICD-10-CM

## 2016-01-16 NOTE — Therapy (Signed)
[place] [facility] MAIN [facility] SERVICES [address] [place], [place], [address] Phone: [phone]   Fax:  [phone]  Physical Therapy Treatment  Patient Details  Name: Andrea Livingston MRN: [id number] Date of Birth: 1955/12/20 Referring Provider: Dalbert Garnet  Encounter Date: 01/16/2016      PT End of Session - 01/16/16 1712    Visit Number 6   Number of Visits 12   PT Start Time 1710   PT Stop Time 1755   PT Time Calculation (min) 45 min   Activity Tolerance Patient tolerated treatment well;No increased pain   Behavior During Therapy WFL for tasks assessed/performed      Past Medical History:  Diagnosis Date  . Barrett esophagus   . Depression   . Hypertension     Past Surgical History:  Procedure Laterality Date  . BREAST BIOPSY  20 years ago   unsure which side  . FOOT SURGERY Bilateral    as a child to help with pain when standing     There were no vitals filed for this visit.      Subjective Assessment - 01/16/16 1712    Subjective Pt reported not able to perform HEP as consistent this past week but she still continues to find her pad to be dryer than SOC.  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     Pertinent History 3 vaginal deliveries w/ episiotomies , physical activity:  swimming freestyle / aerobic walking in the pool 45 min 3-4x/ week . would like to ride bike but can not due to R knee    Patient Stated Goals "any improvement with her incontinence would please me"                            [facility] Adult PT Treatment/Exercise - 01/16/16 1742      Therapeutic Activites    Therapeutic Activities --  see pt instructions     Manual Therapy   Manual therapy comments foot scar massage, Grade I-II mobs along rays I-V of feet B                  PT Education - 01/16/16 1734    Education provided Yes   Education Details HEP   Person(s) Educated Patient   Methods Explanation;Demonstration;Tactile cues;Verbal cues;Handout   Comprehension Verbalized understanding;Returned demonstration             PT Long Term Goals - 01/16/16 1716      PT LONG TERM GOAL #1   Title Pt will decrease nocturia epsiodes from 3x/ night to < 2x night in order to improve sleep quality   Time 12   Period Weeks   Status Achieved     PT LONG TERM GOAL #2   Title Pt will demo proper coordination with pelvic floor activation with simulated cough in order to decrease SUI   Time 12   Period Weeks   Status Achieved     PT LONG TERM GOAL #3   Title Pt will report decreased radiating RLE pain by 50%  across 2 weeks in order to bend and lift objects.    Period Weeks   Status Achieved     PT LONG TERM GOAL #4   Title Pt will demo increased hip abduction strength from 3/5 to 4/5 bilaterally in order to minimize knee pain and to climb stairs.  Time 12   Period Weeks   Status New               Plan - 01/16/16 1742    Clinical Impression Statement Pt is progressing well towards her goals. 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 Pt demo'd correctly and voiced understanding. Pt will continue to benefit from skilled PT.    Rehab Potential Good   PT Frequency 1x / week   PT Duration 12 weeks   PT Treatment/Interventions ADLs/Self Care Home Management;Aquatic Therapy;Electrical Stimulation;Moist Heat;Traction;Gait training;Stair training;Functional mobility training;Therapeutic activities;Therapeutic exercise;Balance training;Patient/family education;Neuromuscular re-education;Manual techniques;Manual lymph drainage;Scar mobilization;Taping;Splinting;Energy conservation   Consulted and Agree with Plan of Care Patient      Patient will benefit from skilled therapeutic intervention in order to improve the following deficits  and impairments:  Decreased coordination, Decreased mobility, Decreased scar mobility, Increased muscle spasms, Postural dysfunction, Improper body mechanics, Pain, Decreased endurance, Decreased range of motion, Decreased activity tolerance, Decreased balance, Decreased safety awareness, Decreased strength, Hypomobility, Impaired flexibility, Difficulty walking, Abnormal gait  Visit Diagnosis: Myalgia  Stress incontinence (female) (female)  Pain in right knee  Lumbago with sciatica, right side  Muscle weakness (generalized)     Problem List There are no active problems to display for this patient.   Mariane MastersYeung,Shin Yiing ,PT, DPT, E-RYT  01/16/2016, 5:55 PM  [place] [facility] [address] [place], [place], [address] Phone: [phone]   Fax:  [phone]  Name: Bryn GullingVictoria R Travelstead MRN: [id number] Date of Birth: November 25, 1955

## 2016-01-16 NOTE — Patient Instructions (Signed)
Continue with pelvic floor strengthening  Deep core strengthening (deep core level 2) 30 reps x 2 /day  Outer core strengthening ( band exercises, sidestepping + mini squat down hallway. Be sure that your knees are behind your toes in mini squat, exhale on rising up)        Decompress after school 10 min: Legs propped up, towel foldded into thirds under armpit , cloth over eyes/ears , palms open up to sky       Work stretches: By door or desk:  Forward bending and scooting hips back , knees bent ... Shift to L and R to lengthen side body . Reach hand to opposite thigh and then palm on same hip )         Distribute weight through legs with slight posterior tilt of pelvis with front knee bent, knee over ankle

## 2016-01-17 ENCOUNTER — Encounter: Payer: 59 | Admitting: Physical Therapy

## 2016-01-31 ENCOUNTER — Encounter: Payer: 59 | Admitting: Physical Therapy

## 2016-02-12 ENCOUNTER — Encounter: Payer: 59 | Admitting: Physical Therapy

## 2016-02-27 ENCOUNTER — Ambulatory Visit: Payer: 59 | Attending: Obstetrics and Gynecology | Admitting: Physical Therapy

## 2016-02-27 DIAGNOSIS — M25561 Pain in right knee: Secondary | ICD-10-CM | POA: Insufficient documentation

## 2016-02-27 DIAGNOSIS — M791 Myalgia, unspecified site: Secondary | ICD-10-CM

## 2016-02-27 DIAGNOSIS — N393 Stress incontinence (female) (male): Secondary | ICD-10-CM | POA: Diagnosis present

## 2016-02-27 DIAGNOSIS — M5441 Lumbago with sciatica, right side: Secondary | ICD-10-CM | POA: Diagnosis present

## 2016-02-27 DIAGNOSIS — M6281 Muscle weakness (generalized): Secondary | ICD-10-CM | POA: Insufficient documentation

## 2016-02-27 DIAGNOSIS — G8929 Other chronic pain: Secondary | ICD-10-CM | POA: Diagnosis present

## 2016-02-27 NOTE — Patient Instructions (Signed)
Figure -4  Towel under opp leg   elbows and shoulders relaxed   5 reps , exhale pull  ____________  Andrea Livingston over , towel is under the thigh that is beneath the other  elbows and shoulders relaxed   5 reps , exhale pull

## 2016-02-28 ENCOUNTER — Encounter: Payer: 59 | Admitting: Physical Therapy

## 2016-02-28 NOTE — Therapy (Signed)
[place] [facility] MAIN [facility] SERVICES [address] [place], [place], [address] Phone: [phone]   Fax:  [phone]  Physical Therapy Treatment  Patient Details  Name: Andrea Livingston MRN: [id number] Date of Birth: 09/12/1955 Referring Provider: Dalbert Garnet  Encounter Date: 02/27/2016      PT End of Session - 02/27/16 1719    Visit Number 7   Number of Visits 12   PT Start Time 1710   PT Stop Time 1745   PT Time Calculation (min) 35 min      Past Medical History:  Diagnosis Date  . Barrett esophagus   . Depression   . Hypertension     Past Surgical History:  Procedure Laterality Date  . BREAST BIOPSY  20 years ago   unsure which side  . FOOT SURGERY Bilateral    as a child to help with pain when standing     There were no vitals filed for this visit.      Subjective Assessment - 02/27/16 1712    Subjective Pt reports her leakage is much much less. Pt stopped wearing a pad. Pt is sleeping better without waking up at night to urinate. Pt has also removed her wedge that she has used to sleep on because she no longer notice acid reflux. Pt feels really pleased with progress because she no longer thinks about leakage after a work day.      Pertinent History 3 vaginal deliveries w/ episiotomies , physical activity:  swimming freestyle / aerobic walking in the pool 45 min 3-4x/ week . would like to ride bike but can not due to R knee    Patient Stated Goals "any improvement with her incontinence would please me"           Assessment: hip abd strength 4/5 B                 [facility] Adult PT Treatment/Exercise - 02/28/16 1808      Therapeutic Activites    Therapeutic Activities --  reassessed goals,discussed progression of HEP, d/c,stretches                PT Education - 02/27/16 1728    Education provided No   Education Details d/c   Person(s) Educated Patient   Methods Explanation;Demonstration;Tactile  cues;Verbal cues;Handout   Comprehension Verbalized understanding;Returned demonstration;Verbal cues required;Tactile cues required             PT Long Term Goals - 02/27/16 1714      PT LONG TERM GOAL #1   Title Pt will decrease nocturia epsiodes from 3x/ night to < 2x night in order to improve sleep quality   Time 12   Period Weeks   Status Achieved     PT LONG TERM GOAL #2   Title Pt will demo proper coordination with pelvic floor activation with simulated cough in order to decrease SUI   Time 12   Period Weeks   Status Achieved     PT LONG TERM GOAL #3   Title Pt will report decreased radiating RLE pain by 50%  across 2 weeks in order to bend and lift objects.    Period Weeks   Status Achieved     PT LONG TERM GOAL #4   Title Pt will demo increased hip abduction strength from 3/5 to 4/5 bilaterally in order to minimize knee pain and to climb stairs.   Time 12   Period Weeks   Status Achieved  Plan - 02/27/16 1716    Clinical Impression Statement Since pt started PT Pt has reported she has improved a "A very great deal better" with urinary incontinence.  Low back pain has improved by 30-40%.   R knee pain has resolved since her injection and she has been able to walk up stairs.    Rehab Potential Good   PT Frequency 1x / week   PT Duration 12 weeks   PT Treatment/Interventions ADLs/Self Care Home Management;Aquatic Therapy;Electrical Stimulation;Moist Heat;Traction;Gait training;Stair training;Functional mobility training;Therapeutic activities;Therapeutic exercise;Balance training;Patient/family education;Neuromuscular re-education;Manual techniques;Manual lymph drainage;Scar mobilization;Taping;Splinting;Energy conservation   Consulted and Agree with Plan of Care Patient      Patient will benefit from skilled therapeutic intervention in order to improve the following deficits and impairments:  Decreased coordination, Decreased mobility,  Decreased scar mobility, Increased muscle spasms, Postural dysfunction, Improper body mechanics, Pain, Decreased endurance, Decreased range of motion, Decreased activity tolerance, Decreased balance, Decreased safety awareness, Decreased strength, Hypomobility, Impaired flexibility, Difficulty walking, Abnormal gait  Visit Diagnosis: Myalgia  Stress incontinence (female) (female)  Chronic pain of right knee  Chronic low back pain with right-sided sciatica, unspecified back pain laterality  Muscle weakness (generalized)     Problem List There are no active problems to display for this patient.   Mariane MastersYeung,Shin Yiing ,PT, DPT, E-RYT  02/28/2016, 6:10 PM  [place] [facility] [address] [place], [place], [address] Phone: [phone]   Fax:  [phone]  Name: Andrea Livingston MRN: [id number] Date of Birth: 1955/06/30

## 2016-05-14 ENCOUNTER — Other Ambulatory Visit: Payer: Self-pay | Admitting: Internal Medicine

## 2016-05-14 DIAGNOSIS — M545 Low back pain: Principal | ICD-10-CM

## 2016-05-14 DIAGNOSIS — G8929 Other chronic pain: Secondary | ICD-10-CM

## 2016-05-28 ENCOUNTER — Ambulatory Visit
Admission: RE | Admit: 2016-05-28 | Discharge: 2016-05-28 | Disposition: A | Discharge status: Home / Self Care | Payer: 59 | Source: Ambulatory Visit | Attending: Internal Medicine | Admitting: Internal Medicine

## 2016-05-28 DIAGNOSIS — G8929 Other chronic pain: Secondary | ICD-10-CM

## 2016-05-28 DIAGNOSIS — M5441 Lumbago with sciatica, right side: Secondary | ICD-10-CM | POA: Insufficient documentation

## 2016-05-28 DIAGNOSIS — M5126 Other intervertebral disc displacement, lumbar region: Secondary | ICD-10-CM | POA: Insufficient documentation

## 2016-05-28 DIAGNOSIS — M48061 Spinal stenosis, lumbar region without neurogenic claudication: Secondary | ICD-10-CM | POA: Diagnosis not present

## 2016-05-28 DIAGNOSIS — M545 Low back pain: Secondary | ICD-10-CM

## 2016-11-26 ENCOUNTER — Other Ambulatory Visit: Payer: Self-pay | Admitting: Internal Medicine

## 2016-11-26 DIAGNOSIS — Z1231 Encounter for screening mammogram for malignant neoplasm of breast: Secondary | ICD-10-CM

## 2017-03-02 ENCOUNTER — Ambulatory Visit: Payer: 59 | Admitting: Anesthesiology

## 2017-03-02 ENCOUNTER — Ambulatory Visit
Admission: RE | Admit: 2017-03-02 | Discharge: 2017-03-02 | Disposition: A | Discharge status: Home / Self Care | Payer: 59 | Source: Ambulatory Visit | Attending: Gastroenterology | Admitting: Gastroenterology

## 2017-03-02 ENCOUNTER — Encounter: Payer: Self-pay | Admitting: *Deleted

## 2017-03-02 ENCOUNTER — Ambulatory Visit
Admission: RE | Admit: 2017-03-02 | Discharge: 2017-03-02 | Disposition: A | Discharge status: Home / Self Care | Payer: 59 | Source: Ambulatory Visit | Attending: Internal Medicine | Admitting: Internal Medicine

## 2017-03-02 ENCOUNTER — Encounter
Admission: RE | Disposition: A | Discharge status: Home / Self Care | Payer: Self-pay | Source: Ambulatory Visit | Attending: Gastroenterology

## 2017-03-02 DIAGNOSIS — F329 Major depressive disorder, single episode, unspecified: Secondary | ICD-10-CM | POA: Diagnosis not present

## 2017-03-02 DIAGNOSIS — Z79899 Other long term (current) drug therapy: Secondary | ICD-10-CM | POA: Insufficient documentation

## 2017-03-02 DIAGNOSIS — I10 Essential (primary) hypertension: Secondary | ICD-10-CM | POA: Diagnosis not present

## 2017-03-02 DIAGNOSIS — K2270 Barrett's esophagus without dysplasia: Secondary | ICD-10-CM | POA: Diagnosis present

## 2017-03-02 DIAGNOSIS — Z7982 Long term (current) use of aspirin: Secondary | ICD-10-CM | POA: Insufficient documentation

## 2017-03-02 DIAGNOSIS — Z1231 Encounter for screening mammogram for malignant neoplasm of breast: Secondary | ICD-10-CM

## 2017-03-02 DIAGNOSIS — K449 Diaphragmatic hernia without obstruction or gangrene: Secondary | ICD-10-CM | POA: Insufficient documentation

## 2017-03-02 HISTORY — PX: ESOPHAGOGASTRODUODENOSCOPY (EGD) WITH PROPOFOL: SHX5813

## 2017-03-02 SURGERY — ESOPHAGOGASTRODUODENOSCOPY (EGD) WITH PROPOFOL
Anesthesia: General

## 2017-03-02 MED ORDER — PROPOFOL 10 MG/ML IV BOLUS
INTRAVENOUS | Status: DC | PRN
  Administered 2017-03-02: 100 mg via INTRAVENOUS

## 2017-03-02 MED ORDER — PROPOFOL 500 MG/50ML IV EMUL
INTRAVENOUS | Status: DC | PRN
  Administered 2017-03-02: 140 ug/kg/min via INTRAVENOUS

## 2017-03-02 MED ORDER — SODIUM CHLORIDE 0.9 % IV SOLN
INTRAVENOUS | Status: DC
  Administered 2017-03-02: 16:00:00 via INTRAVENOUS

## 2017-03-02 MED ORDER — LIDOCAINE HCL 2 % EX GEL
CUTANEOUS | Status: AC
  Filled 2017-03-02: qty 5

## 2017-03-02 MED ORDER — LIDOCAINE 2% (20 MG/ML) 5 ML SYRINGE
INTRAMUSCULAR | Status: DC | PRN
  Administered 2017-03-02: 40 mg via INTRAVENOUS

## 2017-03-02 MED ORDER — FENTANYL CITRATE (PF) 100 MCG/2ML IJ SOLN
INTRAMUSCULAR | Status: AC
  Filled 2017-03-02: qty 2

## 2017-03-02 MED ORDER — FENTANYL CITRATE (PF) 100 MCG/2ML IJ SOLN
INTRAMUSCULAR | Status: DC | PRN
  Administered 2017-03-02: 50 ug via INTRAVENOUS

## 2017-03-02 MED ORDER — SODIUM CHLORIDE 0.9 % IV SOLN: INTRAVENOUS | Status: DC

## 2017-03-02 NOTE — Op Note (Signed)
[facility] Patient Name: Andrea Livingston Procedure Date: 03/02/2017 4:15 PM MRN: [id number] Account #: [id number] Date of Birth: Aug 03, 1955 Admit Type: Outpatient Age: 61 Room: [facility] ENDO ROOM 3 Gender: Female Note Status: Finalized Procedure:            Upper GI endoscopy Indications:          Follow-up of Barrett's esophagus Providers:            Christena Deem, MD Referring MD:         Daniel Nones, MD (Referring MD) Medicines:            Monitored Anesthesia Care Complications:        No immediate complications. Procedure:            Pre-Anesthesia Assessment:                       - ASA Grade Assessment: III - A patient with severe                        systemic disease.                       After obtaining informed consent, the endoscope was                        passed under direct vision. Throughout the procedure,                        the patient's blood pressure, pulse, and oxygen                        saturations were monitored continuously. The Endoscope                        was introduced through the mouth, and advanced to the                        third part of duodenum. The upper GI endoscopy was                        performed with moderate difficulty. The patient                        tolerated the procedure well. Findings:      There were esophageal mucosal changes consistent with short-segment       Barrett's esophagus present at the gastroesophageal junction. The       maximum longitudinal extent of these mucosal changes was 1 cm in length.       Mucosa was biopsied with a cold forceps for histology in 4 quadrants.       The distal esophagus is angulated due to the hiatal hernia.      A medium-sized hiatal hernia was found. The Z-line was a variable       distance from incisors; the hiatal hernia was sliding.      The exam of the stomach was otherwise normal.      The examined duodenum was normal.  papillary lesion found at about 24 cm from the incisors, removed with a       cold forcep for histology.      The cardia and gastric fundus  were normal on retroflexion otherwise. Impression:           - Esophageal mucosal changes consistent with                        short-segment Barrett's esophagus. Biopsied.                       - Medium-sized hiatal hernia.                       - Normal examined duodenum. Recommendation:       - Continue present medications.                       - Await pathology results.                       - Telephone GI clinic for pathology results in 1 week. Procedure Code(s):    --- Professional ---                       [phone], Esophagogastroduodenoscopy, flexible, transoral;                        with biopsy, single or multiple Diagnosis Code(s):    --- Professional ---                       K22.70, Barrett's esophagus without dysplasia                       K44.9, Diaphragmatic hernia without obstruction or                        gangrene CPT copyright 2016 American Medical Association. All rights reserved. The codes documented in this report are preliminary and upon coder review may  be revised to meet current compliance requirements. Christena Deem, MD 03/02/2017 4:40:17 PM This report has been signed electronically. Number of Addenda: 0 Note Initiated On: 03/02/2017 4:15 PM      [facility]

## 2017-03-02 NOTE — Anesthesia Post-op Follow-up Note (Signed)
Anesthesia QCDR form completed.        

## 2017-03-02 NOTE — Anesthesia Preprocedure Evaluation (Addendum)
Anesthesia Evaluation  Patient identified by MRN, date of birth, ID band Patient awake    Reviewed: Allergy & Precautions, NPO status , Patient's Chart, lab work & pertinent test results  History of Anesthesia Complications Negative for: history of anesthetic complications  Airway Mallampati: III  TM Distance: >3 FB Neck ROM: Full    Dental no notable dental hx.    Pulmonary neg pulmonary ROS, neg sleep apnea, neg COPD,    breath sounds clear to auscultation- rhonchi (-) wheezing      Cardiovascular hypertension, Pt. on medications (-) CAD, (-) Past MI and (-) Cardiac Stents  Rhythm:Regular Rate:Normal - Systolic murmurs and - Diastolic murmurs    Neuro/Psych PSYCHIATRIC DISORDERS Depression negative neurological ROS     GI/Hepatic negative GI ROS, Neg liver ROS,   Endo/Other  negative endocrine ROSneg diabetes  Renal/GU negative Renal ROS     Musculoskeletal negative musculoskeletal ROS (+)   Abdominal (+) + obese,   Peds  Hematology negative hematology ROS (+)   Anesthesia Other Findings Past Medical History: No date: Barrett esophagus No date: Depression No date: Hypertension   Reproductive/Obstetrics                             Anesthesia Physical Anesthesia Plan  ASA: II  Anesthesia Plan: General   Post-op Pain Management:    Induction: Intravenous  PONV Risk Score and Plan: 2 and Propofol infusion  Airway Management Planned: Natural Airway  Additional Equipment:   Intra-op Plan:   Post-operative Plan:   Informed Consent: I have reviewed the patients History and Physical, chart, labs and discussed the procedure including the risks, benefits and alternatives for the proposed anesthesia with the patient or authorized representative who has indicated his/her understanding and acceptance.   Dental advisory given  Plan Discussed with: CRNA and  Anesthesiologist  Anesthesia Plan Comments:        Anesthesia Quick Evaluation

## 2017-03-02 NOTE — Anesthesia Postprocedure Evaluation (Signed)
Anesthesia Post Note  Patient: Andrea Livingston  Procedure(s) Performed: ESOPHAGOGASTRODUODENOSCOPY (EGD) WITH PROPOFOL (N/A )  Patient location during evaluation: Endoscopy Anesthesia Type: General Level of consciousness: awake and alert Pain management: pain level controlled Vital Signs Assessment: post-procedure vital signs reviewed and stable Respiratory status: spontaneous breathing, nonlabored ventilation, respiratory function stable and patient connected to nasal cannula oxygen Cardiovascular status: blood pressure returned to baseline and stable Postop Assessment: no apparent nausea or vomiting Anesthetic complications: no     Last Vitals:  Vitals:   03/02/17 1650 03/02/17 1700  BP: 112/75 120/86  Pulse: 77 75  Resp: 19 18  Temp:    SpO2: 97% 100%    Last Pain:  Vitals:   03/02/17 1640  TempSrc: Tympanic                 Lenard Simmer

## 2017-03-02 NOTE — H&P (Signed)
Outpatient short stay form Pre-procedure 03/02/2017 3:07 PM Christena Deem MD  Primary Physician: Dr. Daniel Nones  Reason for visit:  EGD  History of present illness:  Patient is a 61 year old female presenting today as above. She has a personal history of Barrett's esophagus. She does take pantoprazole once daily. She has no reflux or dysphagia symptoms. She takes no aspirin or blood thinning agents with the exception of 81 mg aspirin that has been held for several days    Current Facility-Administered Medications:  .  0.9 %  sodium chloride infusion, , Intravenous, Continuous, Christena Deem, MD .  0.9 %  sodium chloride infusion, , Intravenous, Continuous, Christena Deem, MD  Prescriptions Prior to Admission  Medication Sig Dispense Refill Last Dose  . aspirin EC 81 MG tablet Take 81 mg by mouth daily.   Past Week at Unknown time  . FLUoxetine (PROZAC) 20 MG tablet Take 20 mg by mouth daily.   03/01/2017 at Unknown time  . fluticasone (FLONASE) 50 MCG/ACT nasal spray Place 1 spray into both nostrils daily.     Marland Kitchen ibuprofen (ADVIL,MOTRIN) 800 MG tablet Take 800 mg by mouth every 8 (eight) hours as needed.     Marland Kitchen losartan (COZAAR) 25 MG tablet Take 25 mg by mouth daily.   03/01/2017 at Unknown time  . pantoprazole (PROTONIX) 40 MG tablet Take 40 mg by mouth daily.   03/01/2017 at Unknown time  . buPROPion (WELLBUTRIN XL) 300 MG 24 hr tablet Take 300 mg by mouth daily.   Taking  . meloxicam (MOBIC) 7.5 MG tablet Take 7.5 mg by mouth daily.   Not Taking at Unknown time  . traMADol (ULTRAM) 50 MG tablet Take 50 mg by mouth 3 (three) times daily as needed. Take 1/2 tab tid prn   Not Taking at Unknown time     Allergies  Allergen Reactions  . Hydrochlorothiazide      Past Medical History:  Diagnosis Date  . Barrett esophagus   . Depression   . Hypertension     Review of systems:      Physical Exam    Heart and lungs: Regular rate and rhythm without rub or gallop, lungs  are bilaterally clear.    HEENT: Normocephalic atraumatic eyes are anicteric    Other:     Pertinant exam for procedure: Soft nontender nondistended bowel sounds positive normoactive.    Planned proceedures: EGD and indicated procedures. I have discussed the risks benefits and complications of procedures to include not limited to bleeding, infection, perforation and the risk of sedation and the patient wishes to proceed.    Christena Deem, MD Gastroenterology 03/02/2017  3:07 PM

## 2017-03-02 NOTE — Transfer of Care (Signed)
Immediate Anesthesia Transfer of Care Note  Patient: Andrea Livingston  Procedure(s) Performed: ESOPHAGOGASTRODUODENOSCOPY (EGD) WITH PROPOFOL (N/A )  Patient Location: PACU and Endoscopy Unit  Anesthesia Type:General  Level of Consciousness: drowsy  Airway & Oxygen Therapy: Patient Spontanous Breathing and Patient connected to nasal cannula oxygen  Post-op Assessment: Report given to RN and Post -op Vital signs reviewed and stable  Post vital signs: Reviewed and stable  Last Vitals:  Vitals:   03/02/17 1434  BP: 108/73  Pulse: 85  Resp: 18  Temp: (!) 36.2 C  SpO2: 100%    Last Pain:  Vitals:   03/02/17 1434  TempSrc: Tympanic         Complications: No apparent anesthesia complications

## 2017-03-03 ENCOUNTER — Encounter: Payer: Self-pay | Admitting: Gastroenterology

## 2017-03-06 LAB — SURGICAL PATHOLOGY

## 2018-04-06 ENCOUNTER — Other Ambulatory Visit: Payer: Self-pay | Admitting: Internal Medicine

## 2018-04-06 DIAGNOSIS — Z1231 Encounter for screening mammogram for malignant neoplasm of breast: Secondary | ICD-10-CM

## 2018-05-10 ENCOUNTER — Ambulatory Visit
Admission: RE | Admit: 2018-05-10 | Discharge: 2018-05-10 | Disposition: A | Discharge status: Home / Self Care | Payer: BLUE CROSS/BLUE SHIELD | Source: Ambulatory Visit | Attending: Internal Medicine | Admitting: Internal Medicine

## 2018-05-10 DIAGNOSIS — Z1231 Encounter for screening mammogram for malignant neoplasm of breast: Secondary | ICD-10-CM | POA: Diagnosis not present

## 2019-03-26 IMAGING — MG DIGITAL SCREENING BILATERAL MAMMOGRAM WITH TOMO AND CAD
6 of 12 series · 6 of 36 positions shown · non-contrast
Comparison: Previous exam(s).

ACR Breast Density Category a: The breast tissue is almost entirely
fatty.

CLINICAL DATA: Screening.

EXAM:
DIGITAL SCREENING BILATERAL MAMMOGRAM WITH TOMO AND CAD

[R MLO synth-2D]
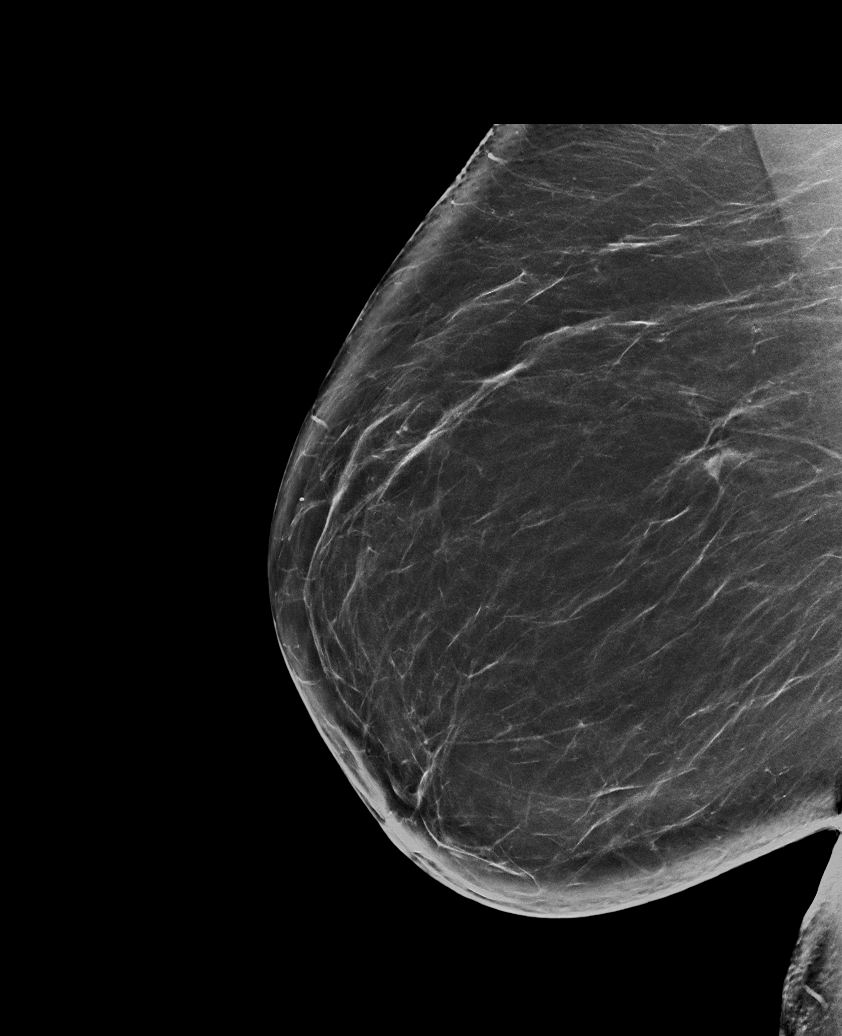

[R CC synth-2D (1 of 2)]
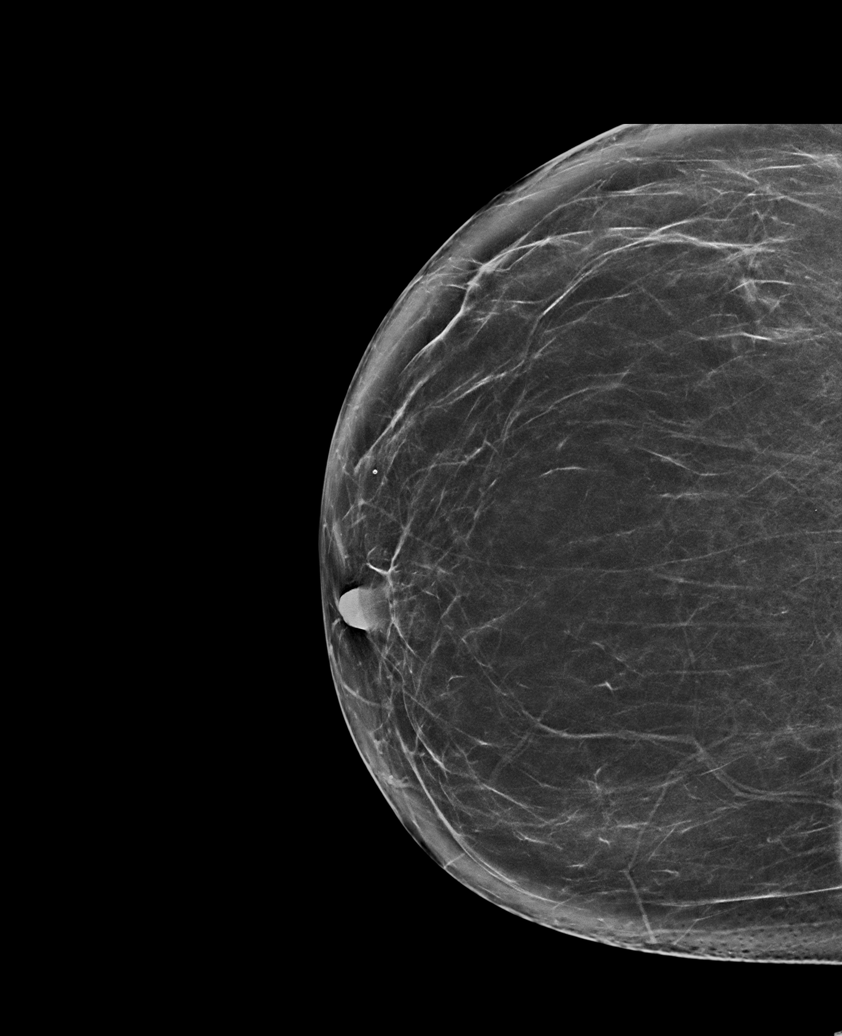

[L CC synth-2D (1 of 2)]
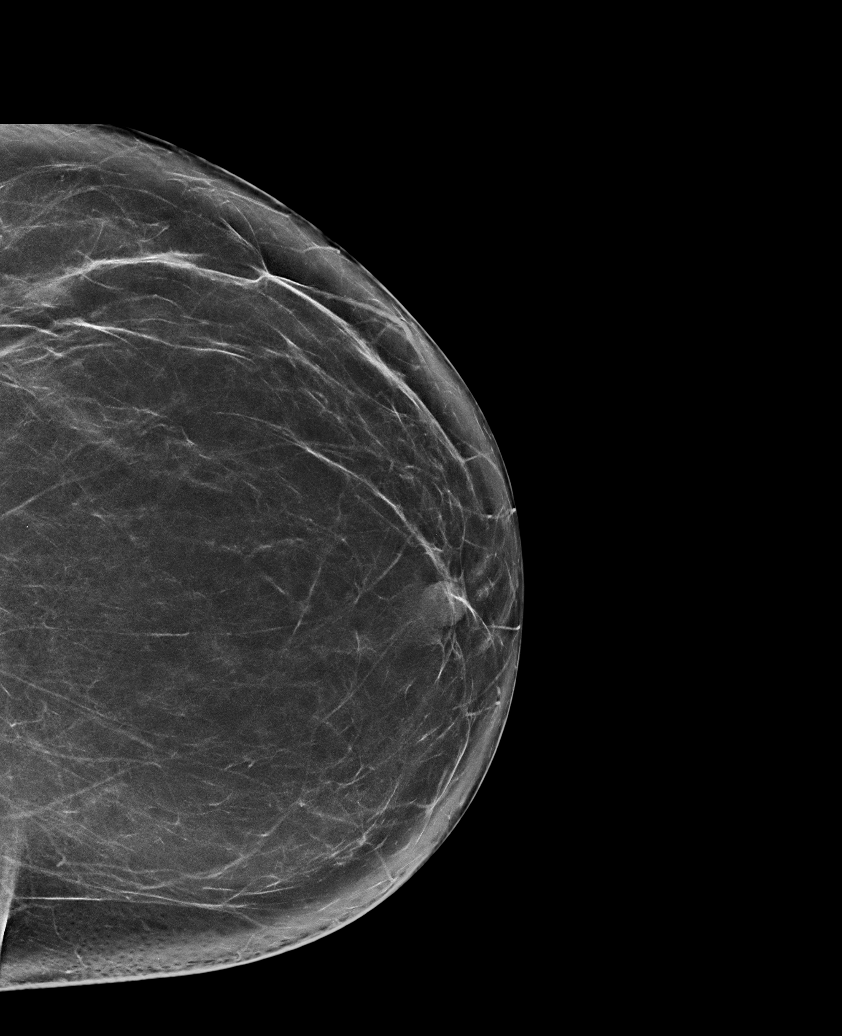

[R CC synth-2D (2 of 2)]
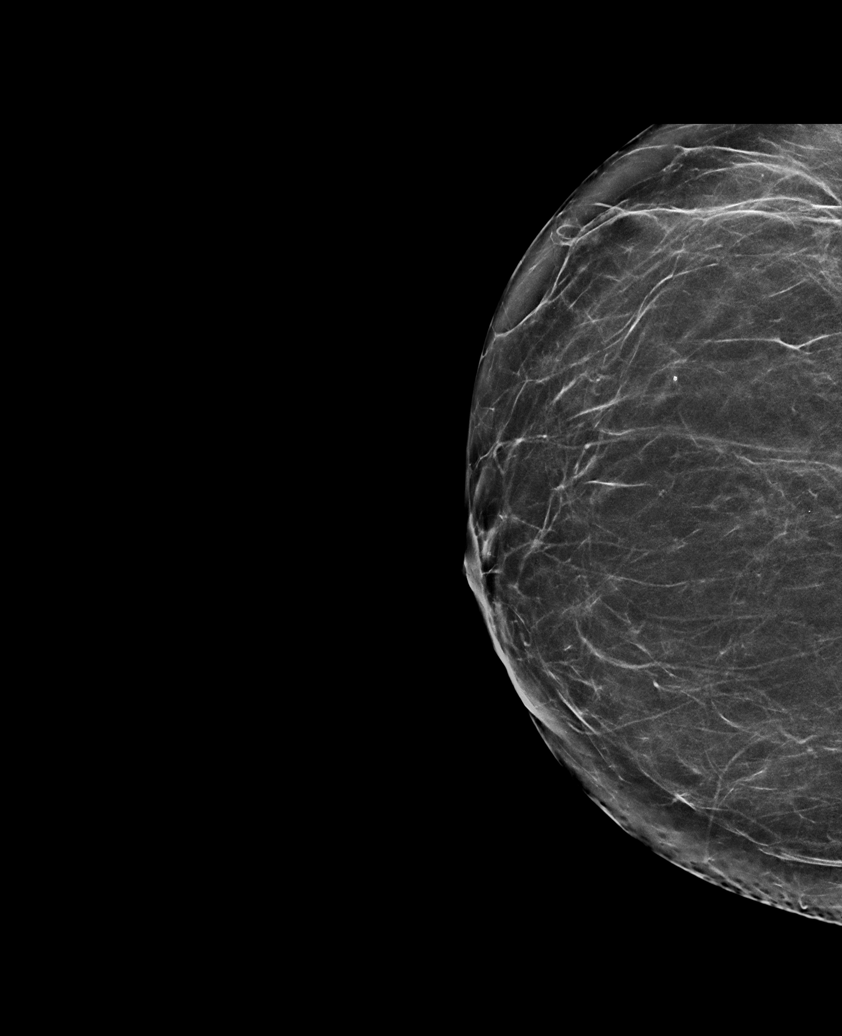

[L MLO synth-2D]
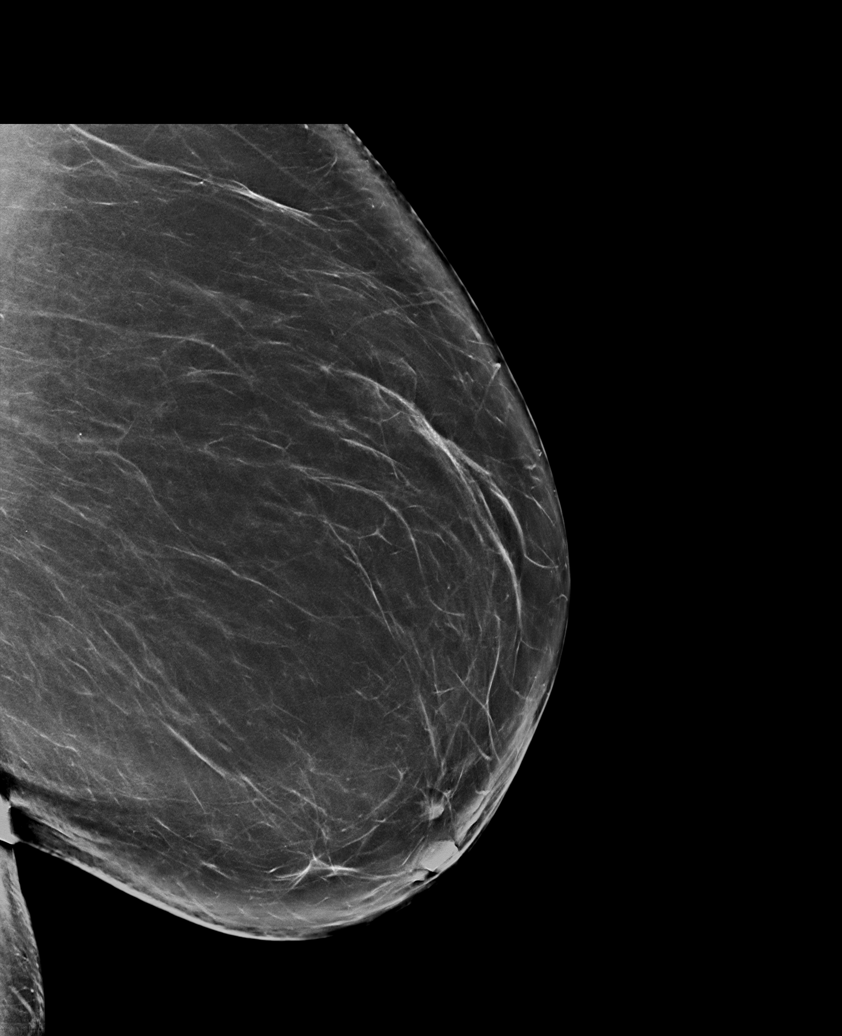

[L CC synth-2D (2 of 2)]
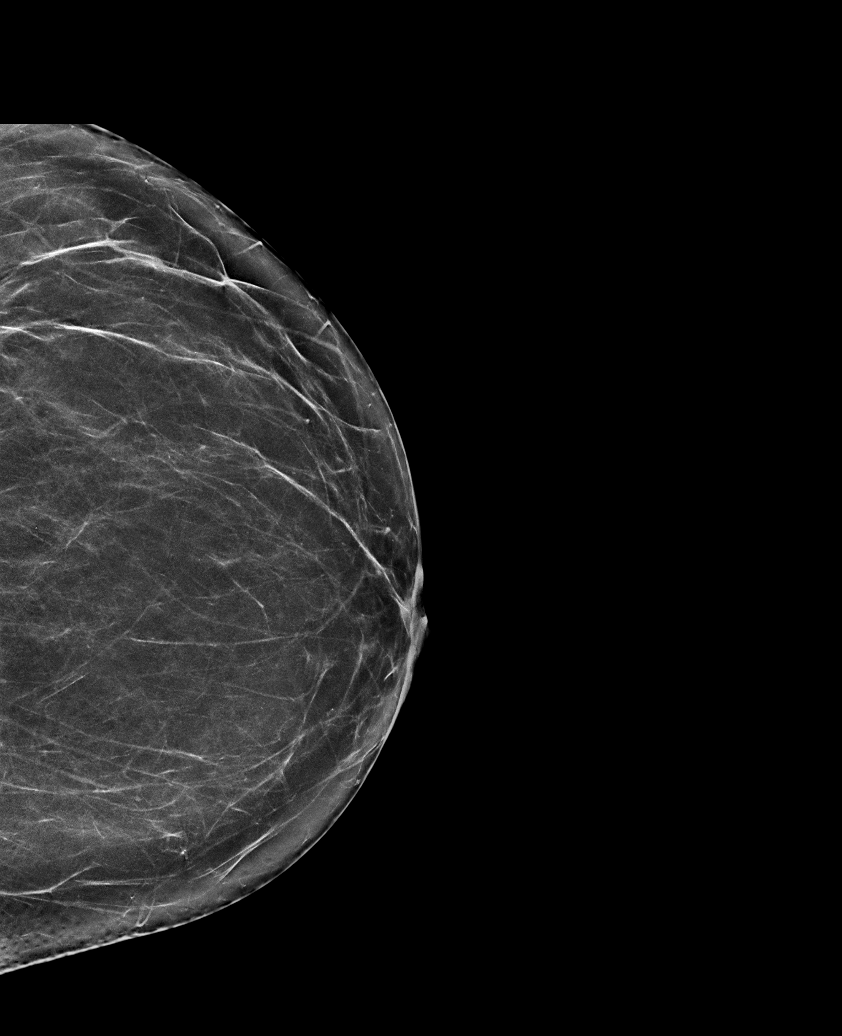

[6 of 36 positions shown; findings below may reference images not displayed]

FINDINGS: There are no findings suspicious for malignancy. Images were
processed with CAD.
IMPRESSION: No mammographic evidence of malignancy. A result letter of this
screening mammogram will be mailed directly to the patient.

RECOMMENDATION:
Screening mammogram in one year. (Code:[id number])

BI-RADS CATEGORY  1: Negative.

## 2019-07-17 ENCOUNTER — Ambulatory Visit: Payer: 59 | Attending: Internal Medicine

## 2019-07-17 DIAGNOSIS — Z23 Encounter for immunization: Secondary | ICD-10-CM | POA: Insufficient documentation

## 2019-07-17 NOTE — Progress Notes (Signed)
   Covid-19 Vaccination Clinic  Name:  Andrea Livingston    MRN: [id number] DOB: 26-Jun-1955  07/17/2019  [name] was observed post Covid-19 immunization for 15 minutes without incidence. She was provided with Vaccine Information Sheet and instruction to access the V-Safe system.   [name] was instructed to call 911 with any severe reactions post vaccine: Marland Kitchen Difficulty breathing  . Swelling of your face and throat  . A fast heartbeat  . A bad rash all over your body  . Dizziness and weakness    Immunizations Administered    Name Date Dose VIS Date Route   Moderna COVID-19 Vaccine 07/17/2019 11:58 AM 0.5 mL 04/25/2019 Intramuscular   Manufacturer: Moderna   Lot: [id number]   NDC: 95583-167-42

## 2019-08-15 ENCOUNTER — Ambulatory Visit: Payer: 59 | Attending: Internal Medicine

## 2019-08-15 DIAGNOSIS — Z23 Encounter for immunization: Secondary | ICD-10-CM

## 2019-08-15 NOTE — Progress Notes (Signed)
   Covid-19 Vaccination Clinic  Name:  Andrea Livingston    MRN: [id number] DOB: 12-Oct-1955  08/15/2019  [name] was observed post Covid-19 immunization for 15 minutes without incident. She was provided with Vaccine Information Sheet and instruction to access the V-Safe system.   [name] was instructed to call 911 with any severe reactions post vaccine: Marland Kitchen Difficulty breathing  . Swelling of face and throat  . A fast heartbeat  . A bad rash all over body  . Dizziness and weakness   Immunizations Administered    Name Date Dose VIS Date Route   Moderna COVID-19 Vaccine 08/15/2019  2:08 PM 0.5 mL 04/25/2019 Intramuscular   Manufacturer: Gala Murdoch   Lot: [id number]   NDC: 49447-395-84

## 2019-12-14 ENCOUNTER — Other Ambulatory Visit: Payer: Self-pay | Admitting: Internal Medicine

## 2019-12-14 DIAGNOSIS — Z1231 Encounter for screening mammogram for malignant neoplasm of breast: Secondary | ICD-10-CM

## 2019-12-29 ENCOUNTER — Ambulatory Visit
Admission: RE | Admit: 2019-12-29 | Discharge: 2019-12-29 | Disposition: A | Discharge status: Home / Self Care | Payer: PRIVATE HEALTH INSURANCE | Source: Ambulatory Visit | Attending: Internal Medicine | Admitting: Internal Medicine

## 2019-12-29 ENCOUNTER — Other Ambulatory Visit: Payer: Self-pay

## 2019-12-29 DIAGNOSIS — Z1231 Encounter for screening mammogram for malignant neoplasm of breast: Secondary | ICD-10-CM | POA: Diagnosis present

## 2020-11-19 ENCOUNTER — Encounter: Payer: Self-pay | Admitting: Ophthalmology

## 2020-11-19 NOTE — Anesthesia Preprocedure Evaluation (Addendum)
Anesthesia Evaluation  Patient identified by MRN, date of birth, ID band Patient awake    Reviewed: Allergy & Precautions, NPO status , Patient's Chart, lab work & pertinent test results  History of Anesthesia Complications (+) PONV and history of anesthetic complications  Airway Mallampati: IV   Neck ROM: Full    Dental no notable dental hx.    Pulmonary neg pulmonary ROS,    Pulmonary exam normal breath sounds clear to auscultation       Cardiovascular hypertension, Normal cardiovascular exam Rhythm:Regular Rate:Normal     Neuro/Psych PSYCHIATRIC DISORDERS Depression Spinal stenosis    GI/Hepatic GERD (Barrett esophagus)  ,  Endo/Other  Class 3 obesity  Renal/GU negative Renal ROS     Musculoskeletal  (+) Arthritis , Rheumatoid disorders,    Abdominal   Peds  Hematology negative hematology ROS (+)   Anesthesia Other Findings   Reproductive/Obstetrics                            Anesthesia Physical Anesthesia Plan  ASA: 3  Anesthesia Plan: MAC   Post-op Pain Management:    Induction: Intravenous  PONV Risk Score and Plan: 2 and TIVA, Midazolam and Treatment may vary due to age or medical condition  Airway Management Planned: Nasal Cannula  Additional Equipment:   Intra-op Plan:   Post-operative Plan:   Informed Consent: I have reviewed the patients History and Physical, chart, labs and discussed the procedure including the risks, benefits and alternatives for the proposed anesthesia with the patient or authorized representative who has indicated his/her understanding and acceptance.       Plan Discussed with: CRNA  Anesthesia Plan Comments:        Anesthesia Quick Evaluation

## 2020-12-03 ENCOUNTER — Encounter: Payer: Self-pay | Admitting: Ophthalmology

## 2020-12-03 ENCOUNTER — Ambulatory Visit: Payer: 59 | Admitting: Anesthesiology

## 2020-12-03 ENCOUNTER — Ambulatory Visit
Admission: RE | Admit: 2020-12-03 | Discharge: 2020-12-03 | Disposition: A | Discharge status: Home / Self Care | Payer: 59 | Attending: Ophthalmology | Admitting: Ophthalmology

## 2020-12-03 ENCOUNTER — Other Ambulatory Visit: Payer: Self-pay

## 2020-12-03 ENCOUNTER — Encounter
Admission: RE | Disposition: A | Discharge status: Home / Self Care | Payer: Self-pay | Source: Home / Self Care | Attending: Ophthalmology

## 2020-12-03 DIAGNOSIS — Z791 Long term (current) use of non-steroidal anti-inflammatories (NSAID): Secondary | ICD-10-CM | POA: Insufficient documentation

## 2020-12-03 DIAGNOSIS — Z888 Allergy status to other drugs, medicaments and biological substances status: Secondary | ICD-10-CM | POA: Diagnosis not present

## 2020-12-03 DIAGNOSIS — K2270 Barrett's esophagus without dysplasia: Secondary | ICD-10-CM | POA: Insufficient documentation

## 2020-12-03 DIAGNOSIS — Z803 Family history of malignant neoplasm of breast: Secondary | ICD-10-CM | POA: Diagnosis not present

## 2020-12-03 DIAGNOSIS — H2511 Age-related nuclear cataract, right eye: Secondary | ICD-10-CM | POA: Insufficient documentation

## 2020-12-03 DIAGNOSIS — E669 Obesity, unspecified: Secondary | ICD-10-CM | POA: Insufficient documentation

## 2020-12-03 DIAGNOSIS — Z79899 Other long term (current) drug therapy: Secondary | ICD-10-CM | POA: Diagnosis not present

## 2020-12-03 DIAGNOSIS — Z7982 Long term (current) use of aspirin: Secondary | ICD-10-CM | POA: Insufficient documentation

## 2020-12-03 DIAGNOSIS — Z6841 Body Mass Index (BMI) 40.0 and over, adult: Secondary | ICD-10-CM | POA: Insufficient documentation

## 2020-12-03 HISTORY — DX: Motion sickness, initial encounter: T75.3XXA

## 2020-12-03 HISTORY — DX: Gastro-esophageal reflux disease without esophagitis: K21.9

## 2020-12-03 HISTORY — PX: CATARACT EXTRACTION W/PHACO: SHX586

## 2020-12-03 HISTORY — DX: Unspecified osteoarthritis, unspecified site: M19.90

## 2020-12-03 SURGERY — PHACOEMULSIFICATION, CATARACT, WITH IOL INSERTION
Anesthesia: Monitor Anesthesia Care | Site: Eye | Laterality: Right

## 2020-12-03 MED ORDER — ACETAMINOPHEN 325 MG PO TABS: 650.0000 mg | ORAL_TABLET | Freq: Once | ORAL | Status: DC | PRN

## 2020-12-03 MED ORDER — SIGHTPATH DOSE#1 NA CHONDROIT SULF-NA HYALURON 40-17 MG/ML IO SOLN
INTRAOCULAR | Status: DC | PRN
  Administered 2020-12-03: 1 mL via INTRAOCULAR

## 2020-12-03 MED ORDER — MOXIFLOXACIN HCL 0.5 % OP SOLN
OPHTHALMIC | Status: DC | PRN
  Administered 2020-12-03: 0.2 mL via OPHTHALMIC

## 2020-12-03 MED ORDER — LACTATED RINGERS IV SOLN: INTRAVENOUS | Status: DC

## 2020-12-03 MED ORDER — ONDANSETRON HCL 4 MG/2ML IJ SOLN
4.0000 mg | Freq: Once | INTRAMUSCULAR | Status: AC | PRN
  Administered 2020-12-03: 4 mg via INTRAVENOUS

## 2020-12-03 MED ORDER — ACETAMINOPHEN 160 MG/5ML PO SOLN: 325.0000 mg | ORAL | Status: DC | PRN

## 2020-12-03 MED ORDER — BRIMONIDINE TARTRATE-TIMOLOL 0.2-0.5 % OP SOLN
OPHTHALMIC | Status: DC | PRN
  Administered 2020-12-03: 1 [drp] via OPHTHALMIC

## 2020-12-03 MED ORDER — TETRACAINE HCL 0.5 % OP SOLN
1.0000 [drp] | OPHTHALMIC | Status: DC | PRN
  Administered 2020-12-03 (×3): 1 [drp] via OPHTHALMIC

## 2020-12-03 MED ORDER — CYCLOPENTOLATE HCL 2 % OP SOLN
1.0000 [drp] | OPHTHALMIC | Status: AC
  Administered 2020-12-03 (×3): 1 [drp] via OPHTHALMIC

## 2020-12-03 MED ORDER — LIDOCAINE HCL (PF) 2 % IJ SOLN
INTRAOCULAR | Status: DC | PRN
  Administered 2020-12-03: 2 mL

## 2020-12-03 MED ORDER — SIGHTPATH DOSE#1 BSS IO SOLN
INTRAOCULAR | Status: DC | PRN
  Administered 2020-12-03: 72 mL via OPHTHALMIC

## 2020-12-03 MED ORDER — MIDAZOLAM HCL 2 MG/2ML IJ SOLN
INTRAMUSCULAR | Status: DC | PRN
  Administered 2020-12-03: 2 mg via INTRAVENOUS

## 2020-12-03 MED ORDER — FENTANYL CITRATE (PF) 100 MCG/2ML IJ SOLN
INTRAMUSCULAR | Status: DC | PRN
  Administered 2020-12-03 (×2): 50 ug via INTRAVENOUS

## 2020-12-03 MED ORDER — PHENYLEPHRINE HCL 10 % OP SOLN
1.0000 [drp] | OPHTHALMIC | Status: AC
  Administered 2020-12-03 (×3): 1 [drp] via OPHTHALMIC

## 2020-12-03 SURGICAL SUPPLY — 18 items
CANNULA ANT/CHMB 27GA (MISCELLANEOUS) ×4 IMPLANT
GLOVE SURG ENC TEXT LTX SZ8 (GLOVE) ×2 IMPLANT
GLOVE SURG TRIUMPH 8.0 PF LTX (GLOVE) ×4 IMPLANT
GOWN STRL REUS W/ TWL LRG LVL3 (GOWN DISPOSABLE) ×2 IMPLANT
GOWN STRL REUS W/TWL LRG LVL3 (GOWN DISPOSABLE) ×4
LENS IOL ACRSF VT TRC 315 12.5 ×1 IMPLANT
LENS IOL ACRYSOF VIVITY 12.5 ×2 IMPLANT
LENS IOL VIVITY 315 12.5 ×1 IMPLANT
MARKER SKIN DUAL TIP RULER LAB (MISCELLANEOUS) ×2 IMPLANT
NEEDLE FILTER BLUNT 18X 1/2SAF (NEEDLE) ×1
NEEDLE FILTER BLUNT 18X1 1/2 (NEEDLE) ×1 IMPLANT
PACK EYE AFTER SURG (MISCELLANEOUS) ×2 IMPLANT
SUT ETHILON 10-0 CS-B-6CS-B-6 (SUTURE)
SUTURE EHLN 10-0 CS-B-6CS-B-6 (SUTURE) IMPLANT
SYR 3ML LL SCALE MARK (SYRINGE) ×2 IMPLANT
SYR TB 1ML LUER SLIP (SYRINGE) ×2 IMPLANT
WATER STERILE IRR 250ML POUR (IV SOLUTION) ×2 IMPLANT
WIPE NON LINTING 3.25X3.25 (MISCELLANEOUS) ×2 IMPLANT

## 2020-12-03 NOTE — H&P (Signed)
[facility]   Primary Care Physician:  Lynnea Ferrier, MD Ophthalmologist: Dr. Druscilla Brownie  Pre-Procedure History & Physical: HPI:  Andrea Livingston is a 65 y.o. female here for cataract surgery.   Past Medical History:  Diagnosis Date   Arthritis    rheumatoid   Barrett esophagus    Depression    GERD (gastroesophageal reflux disease)    Hypertension    Motion sickness     Past Surgical History:  Procedure Laterality Date   BREAST BIOPSY Right 20 years ago   neg   BREAST EXCISIONAL BIOPSY     ESOPHAGOGASTRODUODENOSCOPY (EGD) WITH PROPOFOL N/A 03/02/2017   Procedure: ESOPHAGOGASTRODUODENOSCOPY (EGD) WITH PROPOFOL;  Surgeon: Christena Deem, MD;  Location[facility] ENDOSCOPY;  Service: Endoscopy;  Laterality: N/A;   FOOT SURGERY Bilateral    as a child to help with pain when standing    FRACTURE SURGERY Left    hand    Prior to Admission medications   Medication Sig Start Date End Date Taking? Authorizing Provider  buPROPion (WELLBUTRIN XL) 300 MG 24 hr tablet Take 300 mg by mouth daily.   Yes [provider]  calcium citrate-vitamin D (CITRACAL+D) 315-200 MG-UNIT tablet Take 1 tablet by mouth daily.   Yes [provider]  FLUoxetine (PROZAC) 20 MG tablet Take 20 mg by mouth daily.   Yes [provider]  fluticasone (FLONASE) 50 MCG/ACT nasal spray Place 1 spray into both nostrils as needed.   Yes [provider]  ibuprofen (ADVIL,MOTRIN) 800 MG tablet Take 800 mg by mouth every 8 (eight) hours as needed.   Yes [provider]  losartan (COZAAR) 25 MG tablet Take 25 mg by mouth daily.   Yes [provider]  meloxicam (MOBIC) 7.5 MG tablet Take 7.5 mg by mouth as needed.   Yes [provider]  pantoprazole (PROTONIX) 40 MG tablet Take 40 mg by mouth daily.   Yes [provider]  traMADol (ULTRAM) 50 MG tablet Take 50 mg by mouth 3 (three) times daily as needed. Take 1/2 tab tid prn   Yes  [provider]  aspirin EC 81 MG tablet Take 81 mg by mouth daily. Patient not taking: Reported on 11/19/2020    [provider]    Allergies as of 10/29/2020 - Review Complete 03/02/2017  Allergen Reaction Noted   Hydrochlorothiazide  12/19/2015    Family History  Problem Relation Age of Onset   Breast cancer Maternal Aunt 4   Breast cancer Cousin 48   Breast cancer Cousin 66    Social History   Socioeconomic History   Marital status: Married    Spouse name: Not on file   Number of children: Not on file   Years of education: Not on file   Highest education level: Not on file  Occupational History   Not on file  Tobacco Use   Smoking status: Never   Smokeless tobacco: Never  Substance and Sexual Activity   Alcohol use: Yes    Comment: occasionally   Drug use: No   Sexual activity: Yes    Birth control/protection: Post-menopausal  Other Topics Concern   Not on file  Social History Narrative   Not on file   Social Determinants of Health   Financial Resource Strain: Not on file  Food Insecurity: Not on file  Transportation Needs: Not on file  Physical Activity: Not on file  Stress: Not on file  Social Connections: Not on file  Intimate Partner Violence: Not on file    Review of Systems: See HPI, otherwise negative ROS  Physical Exam: BP 127/81   Pulse 84   Temp 98.9 F (37.2 C) (Temporal)   Resp 16   Ht 5\' 3"  (1.6 m)   Wt 106.1 kg   LMP 12/06/2009   SpO2 97%   BMI 41.45 kg/m  General:   Alert, cooperative in NAD Head:  Normocephalic and atraumatic. Respiratory:  Normal work of breathing. Cardiovascular:  RRR  Impression/Plan: Andrea Livingston is here for cataract surgery.  Risks, benefits, limitations, and alternatives regarding cataract surgery have been reviewed with the patient.  Questions have been answered.  All parties agreeable.   Bryn Gulling, MD  12/03/2020, 8:50 AM

## 2020-12-03 NOTE — Anesthesia Postprocedure Evaluation (Signed)
Anesthesia Post Note  Patient: Andrea Livingston  Procedure(s) Performed: CATARACT EXTRACTION PHACO AND INTRAOCULAR LENS PLACEMENT (IOC) RIGHT VIVTY TORIC 4.19 00:32.6 (Right: Eye)     Patient location during evaluation: PACU Anesthesia Type: MAC Level of consciousness: awake and alert, oriented and patient cooperative Pain management: pain level controlled Vital Signs Assessment: post-procedure vital signs reviewed and stable Respiratory status: spontaneous breathing, nonlabored ventilation and respiratory function stable Cardiovascular status: blood pressure returned to baseline and stable Postop Assessment: adequate PO intake Anesthetic complications: no   No notable events documented.  Darrin Nipper

## 2020-12-03 NOTE — Op Note (Signed)
PREOPERATIVE DIAGNOSIS:  Nuclear sclerotic cataract of the right eye.   POSTOPERATIVE DIAGNOSIS:  Nuclear sclerotic cataract of the right eye.   OPERATIVE PROCEDURE: Procedure(s): CATARACT EXTRACTION PHACO AND INTRAOCULAR LENS PLACEMENT (IOC) RIGHT VIVTY TORIC 4.19 00:32.6   SURGEON:  Galen Manila, MD.   ANESTHESIA: 1.      Managed anesthesia care. 2.     0.52ml of Shugarcaine was instilled following the paracentesis  Anesthesiologist: Reed Breech, MD CRNA: Michaele Offer, CRNA  COMPLICATIONS:  None.   TECHNIQUE:   Stop and chop    DESCRIPTION OF PROCEDURE:  The patient was examined and consented in the preoperative holding area where the aforementioned topical anesthesia was applied to the right eye.  The patient was brought back to the Operating Room where he was sat upright on the gurney and given a target to fixate upon while the eye was marked at the 3:00 and 9:00 position.  The patient was then reclined on the operating table.  The eye was prepped and draped in the usual sterile ophthalmic fashion and a lid speculum was placed. A paracentesis was created with the side port blade and the anterior chamber was filled with viscoelastic. A near clear corneal incision was performed with the steel keratome. A continuous curvilinear capsulorrhexis was performed with a cystotome followed by the capsulorrhexis forceps. Hydrodissection and hydrodelineation were carried out with BSS on a blunt cannula. The lens was removed in a stop and chop technique and the remaining cortical material was removed with the irrigation-aspiration handpiece. The eye was inflated with viscoelastic and the ZCT  lens  was placed in the eye and rotated to within a few degrees of the predetermined orientation.  The remaining viscoelastic was removed from the eye.  The Sinskey hook was used to rotate the toric lens into its final resting place at 017 degrees.  0. The eye was inflated to a physiologic pressure and found to  be watertight. 0.63ml of Vigamox was placed in the anterior chamber.  The eye was dressed with Vigamox. The patient was given protective glasses to wear throughout the day and a shield with which to sleep tonight. The patient was also given drops with which to begin a drop regimen today and will follow-up with me in one day. Implant Name Type Inv. Item Serial No. Manufacturer Lot No. LRB No. Used Action  AcrySof IQ Vivity Toric IOL Intraocular Lens  [id number] ALCON  Right 1 Implanted   Procedure(s): CATARACT EXTRACTION PHACO AND INTRAOCULAR LENS PLACEMENT (IOC) RIGHT VIVTY TORIC 4.19 00:32.6 (Right)  Electronically signed: Galen Manila 12/03/2020 9:21 AM

## 2020-12-03 NOTE — Transfer of Care (Signed)
Immediate Anesthesia Transfer of Care Note  Patient: Andrea Livingston  Procedure(s) Performed: CATARACT EXTRACTION PHACO AND INTRAOCULAR LENS PLACEMENT (IOC) RIGHT VIVTY TORIC 4.19 00:32.6 (Right: Eye)  Patient Location: PACU  Anesthesia Type: MAC  Level of Consciousness: awake, alert  and patient cooperative  Airway and Oxygen Therapy: Patient Spontanous Breathing and Patient connected to supplemental oxygen  Post-op Assessment: Post-op Vital signs reviewed, Patient's Cardiovascular Status Stable, Respiratory Function Stable, Patent Airway and No signs of Nausea or vomiting  Post-op Vital Signs: Reviewed and stable  Complications: No notable events documented.

## 2020-12-06 ENCOUNTER — Encounter: Payer: Self-pay | Admitting: Ophthalmology

## 2020-12-16 NOTE — Discharge Instructions (Signed)

## 2020-12-17 ENCOUNTER — Ambulatory Visit: Payer: 59 | Admitting: Anesthesiology

## 2020-12-17 ENCOUNTER — Other Ambulatory Visit: Payer: Self-pay

## 2020-12-17 ENCOUNTER — Encounter
Admission: RE | Disposition: A | Discharge status: Home / Self Care | Payer: Self-pay | Source: Home / Self Care | Attending: Ophthalmology

## 2020-12-17 ENCOUNTER — Ambulatory Visit
Admission: RE | Admit: 2020-12-17 | Discharge: 2020-12-17 | Disposition: A | Discharge status: Home / Self Care | Payer: 59 | Attending: Ophthalmology | Admitting: Ophthalmology

## 2020-12-17 DIAGNOSIS — Z961 Presence of intraocular lens: Secondary | ICD-10-CM | POA: Insufficient documentation

## 2020-12-17 DIAGNOSIS — Z79899 Other long term (current) drug therapy: Secondary | ICD-10-CM | POA: Insufficient documentation

## 2020-12-17 DIAGNOSIS — H2512 Age-related nuclear cataract, left eye: Secondary | ICD-10-CM | POA: Insufficient documentation

## 2020-12-17 DIAGNOSIS — Z888 Allergy status to other drugs, medicaments and biological substances status: Secondary | ICD-10-CM | POA: Diagnosis not present

## 2020-12-17 DIAGNOSIS — Z9841 Cataract extraction status, right eye: Secondary | ICD-10-CM | POA: Diagnosis not present

## 2020-12-17 HISTORY — PX: CATARACT EXTRACTION W/PHACO: SHX586

## 2020-12-17 SURGERY — PHACOEMULSIFICATION, CATARACT, WITH IOL INSERTION
Anesthesia: Monitor Anesthesia Care | Site: Eye | Laterality: Left

## 2020-12-17 MED ORDER — FENTANYL CITRATE (PF) 100 MCG/2ML IJ SOLN
INTRAMUSCULAR | Status: DC | PRN
  Administered 2020-12-17: 50 ug via INTRAVENOUS
  Administered 2020-12-17 (×2): 25 ug via INTRAVENOUS

## 2020-12-17 MED ORDER — TETRACAINE HCL 0.5 % OP SOLN
1.0000 [drp] | OPHTHALMIC | Status: DC | PRN
  Administered 2020-12-17 (×3): 1 [drp] via OPHTHALMIC

## 2020-12-17 MED ORDER — MIDAZOLAM HCL 2 MG/2ML IJ SOLN
INTRAMUSCULAR | Status: DC | PRN
  Administered 2020-12-17 (×2): 1 mg via INTRAVENOUS

## 2020-12-17 MED ORDER — CYCLOPENTOLATE HCL 2 % OP SOLN
1.0000 [drp] | OPHTHALMIC | Status: DC | PRN
  Administered 2020-12-17 (×3): 1 [drp] via OPHTHALMIC

## 2020-12-17 MED ORDER — PHENYLEPHRINE HCL 10 % OP SOLN
1.0000 [drp] | OPHTHALMIC | Status: DC | PRN
  Administered 2020-12-17 (×3): 1 [drp] via OPHTHALMIC

## 2020-12-17 MED ORDER — SIGHTPATH DOSE#1 NA CHONDROIT SULF-NA HYALURON 40-17 MG/ML IO SOLN
INTRAOCULAR | Status: DC | PRN
  Administered 2020-12-17: 1 mL via INTRAOCULAR

## 2020-12-17 MED ORDER — SIGHTPATH DOSE#1 BSS IO SOLN
INTRAOCULAR | Status: DC | PRN
  Administered 2020-12-17: 2 mL

## 2020-12-17 MED ORDER — SIGHTPATH DOSE#1 BSS IO SOLN
INTRAOCULAR | Status: DC | PRN
  Administered 2020-12-17: 45 mL via OPHTHALMIC

## 2020-12-17 MED ORDER — MOXIFLOXACIN HCL 0.5 % OP SOLN
OPHTHALMIC | Status: DC | PRN
  Administered 2020-12-17: 0.2 mL via OPHTHALMIC

## 2020-12-17 MED ORDER — BRIMONIDINE TARTRATE-TIMOLOL 0.2-0.5 % OP SOLN
OPHTHALMIC | Status: DC | PRN
  Administered 2020-12-17: 1 [drp] via OPHTHALMIC

## 2020-12-17 MED ORDER — SIGHTPATH DOSE#1 BSS IO SOLN
INTRAOCULAR | Status: DC | PRN
  Administered 2020-12-17: 15 mL via INTRAOCULAR

## 2020-12-17 SURGICAL SUPPLY — 15 items
CANNULA ANT/CHMB 27GA (MISCELLANEOUS) ×4 IMPLANT
GLOVE SURG ENC TEXT LTX SZ8 (GLOVE) ×2 IMPLANT
GLOVE SURG TRIUMPH 8.0 PF LTX (GLOVE) ×2 IMPLANT
GOWN STRL REUS W/ TWL LRG LVL3 (GOWN DISPOSABLE) ×2 IMPLANT
GOWN STRL REUS W/TWL LRG LVL3 (GOWN DISPOSABLE) ×4
LENS ACRYSOF IQ VIVITY TORIC ×2 IMPLANT
LENS IOL ACRSF VT TRC 515 13.0 ×1 IMPLANT
MARKER SKIN DUAL TIP RULER LAB (MISCELLANEOUS) ×2 IMPLANT
NEEDLE FILTER BLUNT 18X 1/2SAF (NEEDLE) ×1
NEEDLE FILTER BLUNT 18X1 1/2 (NEEDLE) ×1 IMPLANT
PACK EYE AFTER SURG (MISCELLANEOUS) ×2 IMPLANT
SYR 3ML LL SCALE MARK (SYRINGE) ×2 IMPLANT
SYR TB 1ML LUER SLIP (SYRINGE) ×2 IMPLANT
WATER STERILE IRR 250ML POUR (IV SOLUTION) ×2 IMPLANT
WIPE NON LINTING 3.25X3.25 (MISCELLANEOUS) ×2 IMPLANT

## 2020-12-17 NOTE — Transfer of Care (Signed)
Immediate Anesthesia Transfer of Care Note  Patient: Andrea Livingston  Procedure(s) Performed: CATARACT EXTRACTION PHACO AND INTRAOCULAR LENS PLACEMENT (IOC) LEFT VIVITY TORIC 4.57 00:28.0 (Left: Eye)  Patient Location: PACU  Anesthesia Type: MAC  Level of Consciousness: awake, alert  and patient cooperative  Airway and Oxygen Therapy: Patient Spontanous Breathing and Patient connected to supplemental oxygen  Post-op Assessment: Post-op Vital signs reviewed, Patient's Cardiovascular Status Stable, Respiratory Function Stable, Patent Airway and No signs of Nausea or vomiting  Post-op Vital Signs: Reviewed and stable  Complications: No notable events documented.

## 2020-12-17 NOTE — H&P (Signed)
[facility]   Primary Care Physician:  Lynnea Ferrier, MD Ophthalmologist: Dr Druscilla Brownie  Pre-Procedure History & Physical: HPI:  Andrea Livingston is a 65 y.o. female here for cataract surgery.   Past Medical History:  Diagnosis Date   Arthritis    rheumatoid   Barrett esophagus    Depression    GERD (gastroesophageal reflux disease)    Hypertension    Motion sickness     Past Surgical History:  Procedure Laterality Date   BREAST BIOPSY Right 20 years ago   neg   BREAST EXCISIONAL BIOPSY     CATARACT EXTRACTION W/PHACO Right 12/03/2020   Procedure: CATARACT EXTRACTION PHACO AND INTRAOCULAR LENS PLACEMENT (IOC) RIGHT VIVTY TORIC 4.19 00:32.6;  Surgeon: Galen Manila, MD;  Location: [facility];  Service: Ophthalmology;  Laterality: Right;   ESOPHAGOGASTRODUODENOSCOPY (EGD) WITH PROPOFOL N/A 03/02/2017   Procedure: ESOPHAGOGASTRODUODENOSCOPY (EGD) WITH PROPOFOL;  Surgeon: Christena Deem, MD;  Location: [facility];  Service: Endoscopy;  Laterality: N/A;   FOOT SURGERY Bilateral    as a child to help with pain when standing    FRACTURE SURGERY Left    hand    Prior to Admission medications   Medication Sig Start Date End Date Taking? Authorizing Provider  buPROPion (WELLBUTRIN XL) 300 MG 24 hr tablet Take 300 mg by mouth daily.   Yes [provider]  FLUoxetine (PROZAC) 20 MG tablet Take 20 mg by mouth daily.   Yes [provider]  fluticasone (FLONASE) 50 MCG/ACT nasal spray Place 1 spray into both nostrils as needed.   Yes [provider]  ibuprofen (ADVIL,MOTRIN) 800 MG tablet Take 800 mg by mouth every 8 (eight) hours as needed.   Yes [provider]  losartan (COZAAR) 25 MG tablet Take 25 mg by mouth daily.   Yes [provider]  meloxicam (MOBIC) 7.5 MG tablet Take 7.5 mg by mouth as needed.   Yes [provider]  pantoprazole (PROTONIX) 40 MG tablet Take 40 mg by mouth daily.   Yes  [provider]  traMADol (ULTRAM) 50 MG tablet Take 50 mg by mouth 3 (three) times daily as needed. Take 1/2 tab tid prn   Yes [provider]  aspirin EC 81 MG tablet Take 81 mg by mouth daily. Patient not taking: Reported on 11/19/2020    [provider]  calcium citrate-vitamin D (CITRACAL+D) 315-200 MG-UNIT tablet Take 1 tablet by mouth daily.    [provider]    Allergies as of 10/29/2020 - Review Complete 03/02/2017  Allergen Reaction Noted   Hydrochlorothiazide  12/19/2015    Family History  Problem Relation Age of Onset   Breast cancer Maternal Aunt 78   Breast cancer Cousin 48   Breast cancer Cousin 22    Social History   Socioeconomic History   Marital status: Married    Spouse name: Not on file   Number of children: Not on file   Years of education: Not on file   Highest education level: Not on file  Occupational History   Not on file  Tobacco Use   Smoking status: Never   Smokeless tobacco: Never  Substance and Sexual Activity   Alcohol use: Yes    Comment: occasionally   Drug use: No   Sexual activity: Yes    Birth control/protection: Post-menopausal  Other Topics Concern   Not on file  Social History Narrative   Not on file   Social Determinants  of Health   Financial Resource Strain: Not on file  Food Insecurity: Not on file  Transportation Needs: Not on file  Physical Activity: Not on file  Stress: Not on file  Social Connections: Not on file  Intimate Partner Violence: Not on file    Review of Systems: See HPI, otherwise negative ROS  Physical Exam: BP 123/71   Pulse 74   Temp 97.9 F (36.6 C) (Temporal)   Resp 18   Ht 5\' 3"  (1.6 m)   Wt 105.7 kg   LMP 12/06/2009   SpO2 98%   BMI 41.27 kg/m  General:   Alert, cooperative in NAD Head:  Normocephalic and atraumatic. Respiratory:  Normal work of breathing. Cardiovascular:  RRR  Impression/Plan: Andrea Livingston is here for cataract  surgery.  Risks, benefits, limitations, and alternatives regarding cataract surgery have been reviewed with the patient.  Questions have been answered.  All parties agreeable.   Bryn Gulling, MD  12/17/2020, 8:26 AM

## 2020-12-17 NOTE — Anesthesia Postprocedure Evaluation (Signed)
Anesthesia Post Note  Patient: Andrea Livingston  Procedure(s) Performed: CATARACT EXTRACTION PHACO AND INTRAOCULAR LENS PLACEMENT (IOC) LEFT VIVITY TORIC 4.57 00:28.0 (Left: Eye)     Patient location during evaluation: PACU Anesthesia Type: MAC Level of consciousness: awake Pain management: pain level controlled Vital Signs Assessment: post-procedure vital signs reviewed and stable Respiratory status: respiratory function stable Cardiovascular status: stable Postop Assessment: no apparent nausea or vomiting Anesthetic complications: no   No notable events documented.  Veda Canning

## 2020-12-17 NOTE — Op Note (Addendum)
PREOPERATIVE DIAGNOSIS:  Nuclear sclerotic cataract of the left eye.   POSTOPERATIVE DIAGNOSIS:  Nuclear sclerotic cataract of the left eye.   OPERATIVE PROCEDURE: Procedure(s): CATARACT EXTRACTION PHACO AND INTRAOCULAR LENS PLACEMENT (IOC) LEFT VIVITY TORIC 4.57 00:28.0   SURGEON:  Galen Manila, MD.   ANESTHESIA: 1.      Managed anesthesia care. 2.     0.46ml os Shugarcaine was instilled following the paracentesis [email or web address]   COMPLICATIONS:  None.   TECHNIQUE:   Stop and chop    DESCRIPTION OF PROCEDURE:  The patient was examined and consented in the preoperative holding area where the aforementioned topical anesthesia was applied to the left eye.  The patient was brought back to the Operating Room where he was sat upright on the gurney and given a target to fixate upon while the eye was marked at the 3:00 and 9:00 position.  The patient was then reclined on the operating table.  The eye was prepped and draped in the usual sterile ophthalmic fashion and a lid speculum was placed. A paracentesis was created with the side port blade and the anterior chamber was filled with viscoelastic. A near clear corneal incision was performed with the steel keratome. A continuous curvilinear capsulorrhexis was performed with a cystotome followed by the capsulorrhexis forceps. Hydrodissection and hydrodelineation were carried out with BSS on a blunt cannula. The lens was removed in a stop and chop technique and the remaining cortical material was removed with the irrigation-aspiration handpiece. The eye was inflated with viscoelastic and the ZCT lens was placed in the eye and rotated to within a few degrees of the predetermined orientation.  The remaining viscoelastic was removed from the eye.  The Sinskey hook was used to rotate the toric lens into its final resting place at 164 degrees.  0.1 ml of Vigamox was placed in the anterior chamber. The eye was inflated to a physiologic pressure and found to be  watertight.  The eye was dressed with Vigamox. The patient was given protective glasses to wear throughout the day and a shield with which to sleep tonight. The patient was also given drops with which to begin a drop regimen today and will follow-up with me in one day. Implant Name Type Inv. Item Serial No. Manufacturer Lot No. LRB No. Used Action  AcrySof IQ Vivity Toric IOL Intraocular Lens  [id number] ALCON  Left 1 Implanted   Procedure(s): CATARACT EXTRACTION PHACO AND INTRAOCULAR LENS PLACEMENT (IOC) LEFT VIVITY TORIC 4.57 00:28.0 (Left)  Electronically signed: Galen Manila 7/26/20228:53 AM

## 2020-12-17 NOTE — Anesthesia Preprocedure Evaluation (Addendum)
Anesthesia Evaluation  Patient identified by MRN, date of birth, ID band Patient awake    Reviewed: Allergy & Precautions, NPO status   History of Anesthesia Complications (+) PONV and history of anesthetic complications  Airway Mallampati: IV   Neck ROM: Full    Dental no notable dental hx.    Pulmonary    breath sounds clear to auscultation       Cardiovascular hypertension,  Rhythm:Regular Rate:Normal     Neuro/Psych PSYCHIATRIC DISORDERS Depression Spinal stenosis    GI/Hepatic GERD (Barrett esophagus)  ,  Endo/Other  Morbid obesity (BMI > 40)Class 3 obesity  Renal/GU      Musculoskeletal  (+) Arthritis , Rheumatoid disorders,    Abdominal   Peds  Hematology   Anesthesia Other Findings   Reproductive/Obstetrics                             Anesthesia Physical  Anesthesia Plan  ASA: 3  Anesthesia Plan: MAC   Post-op Pain Management:    Induction: Intravenous  PONV Risk Score and Plan: 2 and TIVA, Midazolam and Treatment may vary due to age or medical condition  Airway Management Planned: Nasal Cannula  Additional Equipment:   Intra-op Plan:   Post-operative Plan:   Informed Consent: I have reviewed the patients History and Physical, chart, labs and discussed the procedure including the risks, benefits and alternatives for the proposed anesthesia with the patient or authorized representative who has indicated his/her understanding and acceptance.       Plan Discussed with: CRNA  Anesthesia Plan Comments:         Anesthesia Quick Evaluation

## 2020-12-17 NOTE — Anesthesia Procedure Notes (Signed)
Procedure Name: MAC Date/Time: 12/17/2020 8:33 AM Performed by: Dionne Bucy, CRNA Pre-anesthesia Checklist: Patient identified, Emergency Drugs available, Suction available, Patient being monitored and Timeout performed Patient Re-evaluated:Patient Re-evaluated prior to induction Oxygen Delivery Method: Nasal cannula Placement Confirmation: positive ETCO2

## 2020-12-18 ENCOUNTER — Encounter: Payer: Self-pay | Admitting: Ophthalmology

## 2020-12-20 ENCOUNTER — Other Ambulatory Visit: Payer: Self-pay | Admitting: Obstetrics and Gynecology

## 2021-03-05 NOTE — H&P (Signed)
Chief Complaint:   Andrea Livingston is a 65 y.o. female presenting with Pre Op Consulting    History of Present Illness: Pt has been having over 9 months of intermittent PMB. Previously, at age 25, she had PMB and required polypectomy. At her last visit, after reviewing benign ultrasound and bx results with the pt, she requested hysterectomy for definitive treatment of her PMB.   Workup: Pap: 02/2020 neg/neg   EMBx 08/2020 DISORDERED PROLIFERATIVE  ENDOMETRIUM. NO HYPERPLASIA OR CARCINOMA  SIS 11/07/2020 - Uterus: 7.11 x 4.39 x 3.46 cm  - Endometrium: 6.58 mm  - Ovaries: not seen  - Other: no masses seen    Pertinent Hx: - NSVD x3 - MUI, s/p PFPT   - Hx of HPV on pap in 2017, normal cells. - Dx Dyspepsia w/ EGD 5/14  Past Medical History:  has a past medical history of Barrett's esophagus determined by biopsy, Bilateral hip pain (Nov 2006), Depression, Dyspepsia, Gastritis (01/12/14), Hypertension, and Plantar warts.  Past Surgical History:  has a past surgical history that includes history of laparoscopy; foot surgery (Bilateral); colonoscopy (07/09/2008); egd (09/26/2012); Colonoscopy; Upper gastrointestinal endoscopy; egd (01/12/2014); egd (03/02/2017); Colonoscopy (06/11/2020); and Cataract extraction (Right). Family History: family history includes High blood pressure (Hypertension) in her father; Liver cancer in her mother; Ulcers in her father. Social History:  reports that she has never smoked. She has never used smokeless tobacco. She reports current alcohol use. She reports that she does not use drugs. OB/GYN History:  OB History     Gravida  3   Para  3   Term  3   Preterm      AB      Living  3      SAB      IAB      Ectopic      Molar      Multiple      Live Births            Allergies: is allergic to hydrochlorothiazide. Medications: Current Outpatient Medications:    buPROPion (WELLBUTRIN XL) 300 MG XL tablet, Take 1 tablet (300  mg total) by mouth once daily, Disp: 90 tablet, Rfl: 3   FLUoxetine (PROZAC) 20 MG tablet, Take 1 tablet (20 mg total) by mouth once daily, Disp: 90 tablet, Rfl: 3   fluticasone propionate (FLONASE) 50 mcg/actuation nasal spray, Place 1 spray into both nostrils 2 (two) times daily, Disp: 16 g, Rfl: 4   ibuprofen (MOTRIN) 800 MG tablet, Take 1 tablet (800 mg total) by mouth 2 (two) times daily (Patient taking differently: Take 800 mg by mouth 2 (two) times daily as needed), Disp: 90 tablet, Rfl: 3   losartan (COZAAR) 25 MG tablet, Take 1 tablet (25 mg total) by mouth once daily, Disp: 90 tablet, Rfl: 3   omeprazole (PRILOSEC) 20 MG DR capsule, Take 1 capsule (20 mg total) by mouth once daily, Disp: 90 capsule, Rfl: 3   triamcinolone 0.1 % cream, Apply topically 2 (two) times daily as needed (rash), Disp: 15 g, Rfl: 2  Review of Systems: No SOB, no palpitations or chest pain, no new lower extremity edema, no nausea or vomiting or bowel or bladder complaints. See HPI for gyn specific ROS.   Exam:   BP 110/70   Ht 160 cm (5\' 3" )   Wt (!) 107 kg (236 lb)   LMP  (LMP Unknown)   BMI 41.81 kg/m   General: Patient is well-groomed, well-nourished, appears  stated age in no acute distress   HEENT: head is atraumatic and normocephalic, trachea is midline, neck is supple with no palpable nodules   CV: Regular rhythm and normal heart rate, no murmur   Pulm: Clear to auscultation throughout lung fields with no wheezing, crackles, or rhonchi. No increased work of breathing  Abdomen: soft , no mass, non-tender, no rebound tenderness, no hepatomegaly  Pelvic: deferred   Impression:   The primary encounter diagnosis was Preop examination. Diagnoses of Post-menopausal bleeding and Endometrial thickening on ultrasound were also pertinent to this visit.  Plan:   1. Postmenopausal bleeding, normal SIS and benign bx -Patient returns for a preoperative discussion regarding her plans to proceed with  surgical treatment of her postmenopausal bleeding, thickened endometrium and proliferative endometrium by robotic assisted total laparoscopic hysterectomy with bilateral salpingectomy oophorectomy procedure.  We may perform a cystoscopy to evaluate the urinary tract after the procedure, if surgically indicated for uro tract integrity.   The patient and I discussed the technical aspects of the procedure including the potential for risks and complications.  These include but are not limited to the risk of infection requiring post-operative antibiotics or further procedures.  We talked about the risk of injury to adjacent organs including bladder, bowel, ureter, blood vessels or nerves.  We talked about the need to convert to an open incision.  We talked about the possible need for blood transfusion.  We talked about postop complications such as thromboembolic or cardiopulmonary complications.  All of her questions were answered.  Her preoperative exam was completed and the appropriate consents were signed. She is scheduled to undergo this procedure in the near future.  Specific Peri-operative Considerations:  - Consent: obtained today - Health Maintenance:  - Labs: CBC, CMP preoperatively - Studies: EKG, CXR preoperatively - Bowel Preparation: None required - Abx:  Ancef 2g - VTE ppx: SCDs perioperatively  Diagnoses and all orders for this visit:  Preop examination  Post-menopausal bleeding  Endometrial thickening on ultrasound  Return for Postop check.   ~~~~~~~~~~~~~~~~~~~~~~~~~~~~~~~~~~~~~~~~~~~~~~~~~~~~~~~~~~~~ This note is partially written by Jerene Canny, in the presence of and acting as the scribe of Dr. Christeen Douglas, who has reviewed, edited and added to the note to reflect her best personal medical judgment.  This note was generated in part with voice recognition software and I apologize for any typographical errors that were not detected and corrected.    Cecilie Kicks, MD

## 2021-03-06 NOTE — H&P (Signed)
Andrea Livingston is a 66 y.o. female presenting with Pre Op Consulting     History of Present Illness: Pt has been having over 9 months of intermittent PMB. Previously, at age 27, she had PMB and required polypectomy. At her last visit, after reviewing benign ultrasound and bx results with the pt, she requested hysterectomy for definitive treatment of her PMB.    Workup: Pap: 02/2020 neg/neg   EMBx 08/2020 DISORDERED PROLIFERATIVE  ENDOMETRIUM. NO HYPERPLASIA OR CARCINOMA  SIS 11/07/2020 - Uterus: 7.11 x 4.39 x 3.46 cm  - Endometrium: 6.58 mm  - Ovaries: not seen  - Other: no masses seen    Pertinent Hx: - NSVD x3 - MUI, s/p PFPT   - Hx of HPV on pap in 2017, normal cells. - Dx Dyspepsia w/ EGD 5/14   Past Medical History:  has a past medical history of Barrett's esophagus determined by biopsy, Bilateral hip pain (Nov 2006), Depression, Dyspepsia, Gastritis (01/12/14), Hypertension, and Plantar warts.  Past Surgical History:  has a past surgical history that includes history of laparoscopy; foot surgery (Bilateral); colonoscopy (07/09/2008); egd (09/26/2012); Colonoscopy; Upper gastrointestinal endoscopy; egd (01/12/2014); egd (03/02/2017); Colonoscopy (06/11/2020); and Cataract extraction (Right). Family History: family history includes High blood pressure (Hypertension) in her father; Liver cancer in her mother; Ulcers in her father. Social History:  reports that she has never smoked. She has never used smokeless tobacco. She reports current alcohol use. She reports that she does not use drugs. OB/GYN History:          OB History     Gravida  3   Para  3   Term  3   Preterm      AB      Living  3      SAB      IAB      Ectopic      Molar      Multiple      Live Births              Allergies: is allergic to hydrochlorothiazide. Medications: Current Outpatient Medications:    buPROPion (WELLBUTRIN XL) 300 MG XL tablet, Take 1 tablet (300 mg total)  by mouth once daily, Disp: 90 tablet, Rfl: 3   FLUoxetine (PROZAC) 20 MG tablet, Take 1 tablet (20 mg total) by mouth once daily, Disp: 90 tablet, Rfl: 3   fluticasone propionate (FLONASE) 50 mcg/actuation nasal spray, Place 1 spray into both nostrils 2 (two) times daily, Disp: 16 g, Rfl: 4   ibuprofen (MOTRIN) 800 MG tablet, Take 1 tablet (800 mg total) by mouth 2 (two) times daily (Patient taking differently: Take 800 mg by mouth 2 (two) times daily as needed), Disp: 90 tablet, Rfl: 3   losartan (COZAAR) 25 MG tablet, Take 1 tablet (25 mg total) by mouth once daily, Disp: 90 tablet, Rfl: 3   omeprazole (PRILOSEC) 20 MG DR capsule, Take 1 capsule (20 mg total) by mouth once daily, Disp: 90 capsule, Rfl: 3   triamcinolone 0.1 % cream, Apply topically 2 (two) times daily as needed (rash), Disp: 15 g, Rfl: 2   Review of Systems: No SOB, no palpitations or chest pain, no new lower extremity edema, no nausea or vomiting or bowel or bladder complaints. See HPI for gyn specific ROS.    Exam:    BP 110/70   Ht 160 cm (5\' 3" )   Wt (!) 107 kg (236 lb)   LMP  (LMP Unknown)  BMI 41.81 kg/m    General: Patient is well-groomed, well-nourished, appears stated age in no acute distress   HEENT: head is atraumatic and normocephalic, trachea is midline, neck is supple with no palpable nodules   CV: Regular rhythm and normal heart rate, no murmur   Pulm: Clear to auscultation throughout lung fields with no wheezing, crackles, or rhonchi. No increased work of breathing   Abdomen: soft , no mass, non-tender, no rebound tenderness, no hepatomegaly   Pelvic: deferred    Impression:    The primary encounter diagnosis was Preop examination. Diagnoses of Post-menopausal bleeding and Endometrial thickening on ultrasound were also pertinent to this visit.   Plan:    1. Postmenopausal bleeding, normal SIS and benign bx -Patient returns for a preoperative discussion regarding her plans to proceed with  surgical treatment of her postmenopausal bleeding, thickened endometrium and proliferative endometrium by robotic assisted total laparoscopic hysterectomy with bilateral salpingectomy oophorectomy procedure.  We may perform a cystoscopy to evaluate the urinary tract after the procedure, if surgically indicated for uro tract integrity.    The patient and I discussed the technical aspects of the procedure including the potential for risks and complications.  These include but are not limited to the risk of infection requiring post-operative antibiotics or further procedures.  We talked about the risk of injury to adjacent organs including bladder, bowel, ureter, blood vessels or nerves.  We talked about the need to convert to an open incision.  We talked about the possible need for blood transfusion.  We talked about postop complications such as thromboembolic or cardiopulmonary complications.  All of her questions were answered.  Her preoperative exam was completed and the appropriate consents were signed. She is scheduled to undergo this procedure in the near future.   Specific Peri-operative Considerations:  - Consent: obtained today - Health Maintenance:  - Labs: CBC, CMP preoperatively - Studies: EKG, CXR preoperatively - Bowel Preparation: None required - Abx:  Ancef 2g - VTE ppx: SCDs perioperatively   Diagnoses and all orders for this visit:   Preop examination   Post-menopausal bleeding   Endometrial thickening on ultrasound       Return for Postop check.

## 2021-03-11 ENCOUNTER — Other Ambulatory Visit: Payer: Self-pay

## 2021-03-11 ENCOUNTER — Other Ambulatory Visit
Admission: RE | Admit: 2021-03-11 | Discharge: 2021-03-11 | Disposition: A | Discharge status: Home / Self Care | Payer: 59 | Source: Ambulatory Visit | Attending: Obstetrics and Gynecology | Admitting: Obstetrics and Gynecology

## 2021-03-11 DIAGNOSIS — Z01818 Encounter for other preprocedural examination: Secondary | ICD-10-CM | POA: Insufficient documentation

## 2021-03-11 NOTE — Patient Instructions (Addendum)
Your procedure is scheduled on: 03/21/21 - Friday Report to the Registration Desk on the 1st floor of the [facility]. To find out your arrival time, please call [phone] between 1PM - 3PM on: 03/20/21 - Thursday Report to [facility] on 03/12/21 at 10:30 am.  REMEMBER: Instructions that are not followed completely may result in serious medical risk, up to and including death; or upon the discretion of your surgeon and anesthesiologist your surgery may need to be rescheduled.  Do not eat food after midnight the night before surgery.  No gum chewing, lozengers or hard candies.  You may however, drink CLEAR liquids up to 2 hours before you are scheduled to arrive for your surgery. Do not drink anything within 2 hours of your scheduled arrival time.  Clear liquids include: - water  - apple juice without pulp - gatorade (not RED, PURPLE, OR BLUE) - black coffee or tea (Do NOT add milk or creamers to the coffee or tea) Do NOT drink anything that is not on this list.  TAKE THESE MEDICATIONS THE MORNING OF SURGERY WITH A SIP OF WATER:  - omeprazole (PRILOSEC) 20 MG capsule, (take one the night before and one on the morning of surgery - helps to prevent nausea after surgery.)  One week prior to surgery: Stop Anti-inflammatories (NSAIDS) such as Advil, Aleve, Ibuprofen, Motrin, Naproxen, Naprosyn and Aspirin based products such as Excedrin, Goodys Powder, BC Powder.  Stop ANY OVER THE COUNTER supplements until after surgery.  You may take Tylenol if needed for pain up until the day of surgery.  No Alcohol for 24 hours before or after surgery.  No Smoking including e-cigarettes for 24 hours prior to surgery.  No chewable tobacco products for at least 6 hours prior to surgery.  No nicotine patches on the day of surgery.  Do not use any "recreational" drugs for at least a week prior to your surgery.  Please be advised that the combination of cocaine and  anesthesia may have negative outcomes, up to and including death. If you test positive for cocaine, your surgery will be cancelled.  On the morning of surgery brush your teeth with toothpaste and water, you may rinse your mouth with mouthwash if you wish. Do not swallow any toothpaste or mouthwash.  Use CHG Soap or wipes as directed on instruction sheet.  Do not wear jewelry, make-up, hairpins, clips or nail polish.  Do not wear lotions, powders, or perfumes.   Do not shave body from the neck down 48 hours prior to surgery just in case you cut yourself which could leave a site for infection.  Also, freshly shaved skin may become irritated if using the CHG soap.  Contact lenses, hearing aids and dentures may not be worn into surgery.  Do not bring valuables to the hospital. [facility] is not responsible for any missing/lost belongings or valuables.   Notify your doctor if there is any change in your medical condition (cold, fever, infection).  Wear comfortable clothing (specific to your surgery type) to the hospital.  After surgery, you can help prevent lung complications by doing breathing exercises.  Take deep breaths and cough every 1-2 hours. Your doctor may order a device called an Incentive Spirometer to help you take deep breaths. When coughing or sneezing, hold a pillow firmly against your incision with both hands. This is called "splinting." Doing this helps protect your incision. It also decreases belly discomfort.  If you are being admitted  to the hospital overnight, leave your suitcase in the car. After surgery it may be brought to your room.  If you are being discharged the day of surgery, you will not be allowed to drive home. You will need a responsible adult (18 years or older) to drive you home and stay with you that night.   If you are taking public transportation, you will need to have a responsible adult (18 years or older) with you. Please confirm with your  physician that it is acceptable to use public transportation.   Please call the Pre-admissions Testing Dept. at [phone] if you have any questions about these instructions.  Surgery Visitation Policy:  Patients undergoing a surgery or procedure may have one family member or support person with them as long as that person is not COVID-19 positive or experiencing its symptoms.  That person may remain in the waiting area during the procedure and may rotate out with other people.  Inpatient Visitation:    Visiting hours are 7 a.m. to 8 p.m. Up to two visitors ages 16+ are allowed at one time in a patient room. The visitors may rotate out with other people during the day. Visitors must check out when they leave, or other visitors will not be allowed. One designated support person may remain overnight. The visitor must pass COVID-19 screenings, use hand sanitizer when entering and exiting the patient's room and wear a mask at all times, including in the patient's room. Patients must also wear a mask when staff or their visitor are in the room. Masking is required regardless of vaccination status.

## 2021-03-12 ENCOUNTER — Encounter: Payer: Self-pay | Admitting: Urgent Care

## 2021-03-12 ENCOUNTER — Other Ambulatory Visit
Admission: RE | Admit: 2021-03-12 | Discharge: 2021-03-12 | Disposition: A | Discharge status: Home / Self Care | Payer: 59 | Source: Ambulatory Visit | Attending: Obstetrics and Gynecology | Admitting: Obstetrics and Gynecology

## 2021-03-12 DIAGNOSIS — N950 Postmenopausal bleeding: Secondary | ICD-10-CM

## 2021-03-12 DIAGNOSIS — Z01818 Encounter for other preprocedural examination: Secondary | ICD-10-CM | POA: Diagnosis not present

## 2021-03-12 LAB — CBC
HCT: 41.6 % (ref 36.0–46.0)
Hemoglobin: 14 g/dL (ref 12.0–15.0)
MCH: 29.4 pg (ref 26.0–34.0)
MCHC: 33.7 g/dL (ref 30.0–36.0)
MCV: 87.4 fL (ref 80.0–100.0)
Platelets: 342 10*3/uL (ref 150–400)
RBC: 4.76 MIL/uL (ref 3.87–5.11)
RDW: 13.6 % (ref 11.5–15.5)
WBC: 6.3 10*3/uL (ref 4.0–10.5)
nRBC: 0 % (ref 0.0–0.2)

## 2021-03-12 LAB — BASIC METABOLIC PANEL
Anion gap: 3 — ABNORMAL LOW (ref 5–15)
BUN: 16 mg/dL (ref 8–23)
CO2: 27 mmol/L (ref 22–32)
Calcium: 9.5 mg/dL (ref 8.9–10.3)
Chloride: 107 mmol/L (ref 98–111)
Creatinine, Ser: 0.89 mg/dL (ref 0.44–1.00)
GFR, Estimated: >60 mL/min (ref >60)
Glucose, Bld: 88 mg/dL (ref 70–99)
Potassium: 4.2 mmol/L (ref 3.5–5.1)
Sodium: 137 mmol/L (ref 135–145)

## 2021-03-12 LAB — TYPE AND SCREEN
ABO/RH(D): A POS
Antibody Screen: NEGATIVE

## 2021-03-20 MED ORDER — CEFAZOLIN SODIUM-DEXTROSE 2-4 GM/100ML-% IV SOLN
2.0000 g | INTRAVENOUS | Status: AC
  Administered 2021-03-21: 2 g via INTRAVENOUS

## 2021-03-20 MED ORDER — ACETAMINOPHEN 500 MG PO TABS: 1000.0000 mg | ORAL_TABLET | ORAL | Status: AC

## 2021-03-20 MED ORDER — GABAPENTIN 300 MG PO CAPS: 300.0000 mg | ORAL_CAPSULE | ORAL | Status: AC

## 2021-03-20 MED ORDER — POVIDONE-IODINE 10 % EX SWAB: 2.0000 "application " | Freq: Once | CUTANEOUS | Status: DC

## 2021-03-20 MED ORDER — LACTATED RINGERS IV SOLN: INTRAVENOUS | Status: DC

## 2021-03-21 ENCOUNTER — Other Ambulatory Visit: Payer: Self-pay

## 2021-03-21 ENCOUNTER — Ambulatory Visit: Payer: 59 | Admitting: Anesthesiology

## 2021-03-21 ENCOUNTER — Encounter: Payer: Self-pay | Admitting: Obstetrics and Gynecology

## 2021-03-21 ENCOUNTER — Encounter
Admission: RE | Disposition: A | Discharge status: Home / Self Care | Payer: Self-pay | Source: Home / Self Care | Attending: Obstetrics and Gynecology

## 2021-03-21 ENCOUNTER — Ambulatory Visit
Admission: RE | Admit: 2021-03-21 | Discharge: 2021-03-21 | Disposition: A | Discharge status: Home / Self Care | Payer: 59 | Attending: Obstetrics and Gynecology | Admitting: Obstetrics and Gynecology

## 2021-03-21 DIAGNOSIS — Z79899 Other long term (current) drug therapy: Secondary | ICD-10-CM | POA: Insufficient documentation

## 2021-03-21 DIAGNOSIS — N840 Polyp of corpus uteri: Secondary | ICD-10-CM | POA: Insufficient documentation

## 2021-03-21 DIAGNOSIS — N950 Postmenopausal bleeding: Secondary | ICD-10-CM | POA: Insufficient documentation

## 2021-03-21 HISTORY — DX: Other specified postprocedural states: R11.2

## 2021-03-21 HISTORY — DX: Other specified postprocedural states: Z98.890

## 2021-03-21 HISTORY — PX: CYSTOSCOPY: SHX5120

## 2021-03-21 HISTORY — PX: ROBOTIC ASSISTED TOTAL HYSTERECTOMY WITH BILATERAL SALPINGO OOPHERECTOMY: SHX6086

## 2021-03-21 LAB — ABO/RH: ABO/RH(D): A POS

## 2021-03-21 SURGERY — HYSTERECTOMY, TOTAL, ROBOT-ASSISTED, LAPAROSCOPIC, WITH BILATERAL SALPINGO-OOPHORECTOMY
Anesthesia: General

## 2021-03-21 MED ORDER — PROPOFOL 500 MG/50ML IV EMUL
INTRAVENOUS | Status: DC | PRN
  Administered 2021-03-21: 150 ug/kg/min via INTRAVENOUS

## 2021-03-21 MED ORDER — SODIUM CHLORIDE 0.9 % IR SOLN
Status: DC | PRN
  Administered 2021-03-21: 4000 mL via INTRAVESICAL

## 2021-03-21 MED ORDER — HEMOSTATIC AGENTS (NO CHARGE) OPTIME
TOPICAL | Status: DC | PRN
  Administered 2021-03-21: 1 via TOPICAL

## 2021-03-21 MED ORDER — 0.9 % SODIUM CHLORIDE (POUR BTL) OPTIME
TOPICAL | Status: DC | PRN
  Administered 2021-03-21: 500 mL

## 2021-03-21 MED ORDER — LIDOCAINE HCL (CARDIAC) PF 100 MG/5ML IV SOSY
PREFILLED_SYRINGE | INTRAVENOUS | Status: DC | PRN
  Administered 2021-03-21: 100 mg via INTRAVENOUS

## 2021-03-21 MED ORDER — DEXAMETHASONE SODIUM PHOSPHATE 10 MG/ML IJ SOLN
INTRAMUSCULAR | Status: DC | PRN
  Administered 2021-03-21: 10 mg via INTRAVENOUS

## 2021-03-21 MED ORDER — ACETAMINOPHEN 500 MG PO TABS: 1000.0000 mg | ORAL_TABLET | Freq: Four times a day (QID) | ORAL | 0 refills | Status: AC

## 2021-03-21 MED ORDER — ONDANSETRON HCL 4 MG/2ML IJ SOLN
INTRAMUSCULAR | Status: AC
  Filled 2021-03-21: qty 2

## 2021-03-21 MED ORDER — VASOPRESSIN 20 UNIT/ML IV SOLN
INTRAVENOUS | Status: AC
  Filled 2021-03-21: qty 1

## 2021-03-21 MED ORDER — CEFAZOLIN SODIUM-DEXTROSE 2-4 GM/100ML-% IV SOLN
INTRAVENOUS | Status: AC
  Filled 2021-03-21: qty 100

## 2021-03-21 MED ORDER — PHENYLEPHRINE HCL (PRESSORS) 10 MG/ML IV SOLN
INTRAVENOUS | Status: AC
  Filled 2021-03-21: qty 1

## 2021-03-21 MED ORDER — ACETAMINOPHEN 500 MG PO TABS
ORAL_TABLET | ORAL | Status: AC
  Administered 2021-03-21: 1000 mg via ORAL
  Filled 2021-03-21: qty 2

## 2021-03-21 MED ORDER — BUPIVACAINE HCL (PF) 0.5 % IJ SOLN
INTRAMUSCULAR | Status: DC | PRN
  Administered 2021-03-21: 16 mL

## 2021-03-21 MED ORDER — SUGAMMADEX SODIUM 200 MG/2ML IV SOLN
INTRAVENOUS | Status: DC | PRN
  Administered 2021-03-21: 200 mg via INTRAVENOUS

## 2021-03-21 MED ORDER — FENTANYL CITRATE (PF) 250 MCG/5ML IJ SOLN
INTRAMUSCULAR | Status: AC
  Filled 2021-03-21: qty 5

## 2021-03-21 MED ORDER — CHLORHEXIDINE GLUCONATE 0.12 % MT SOLN
OROMUCOSAL | Status: AC
  Administered 2021-03-21: 15 mL via OROMUCOSAL
  Filled 2021-03-21: qty 15

## 2021-03-21 MED ORDER — SCOPOLAMINE 1 MG/3DAYS TD PT72
MEDICATED_PATCH | TRANSDERMAL | Status: AC
  Filled 2021-03-21: qty 1

## 2021-03-21 MED ORDER — SCOPOLAMINE 1 MG/3DAYS TD PT72
MEDICATED_PATCH | TRANSDERMAL | Status: DC | PRN
  Administered 2021-03-21: 1 via TRANSDERMAL

## 2021-03-21 MED ORDER — ROCURONIUM BROMIDE 10 MG/ML (PF) SYRINGE
PREFILLED_SYRINGE | INTRAVENOUS | Status: AC
  Filled 2021-03-21: qty 10

## 2021-03-21 MED ORDER — GABAPENTIN 800 MG PO TABS: 800.0000 mg | ORAL_TABLET | Freq: Every day | ORAL | 0 refills | Status: AC

## 2021-03-21 MED ORDER — IBUPROFEN 800 MG PO TABS: 800.0000 mg | ORAL_TABLET | Freq: Three times a day (TID) | ORAL | 1 refills | Status: AC

## 2021-03-21 MED ORDER — MEPERIDINE HCL 25 MG/ML IJ SOLN: 6.2500 mg | INTRAMUSCULAR | Status: DC | PRN

## 2021-03-21 MED ORDER — EPHEDRINE SULFATE 50 MG/ML IJ SOLN
INTRAMUSCULAR | Status: DC | PRN
  Administered 2021-03-21 (×4): 10 mg via INTRAVENOUS

## 2021-03-21 MED ORDER — FLUORESCEIN SODIUM 10 % IV SOLN
INTRAVENOUS | Status: DC | PRN
  Administered 2021-03-21: .5 mL via INTRAVENOUS

## 2021-03-21 MED ORDER — PROMETHAZINE HCL 25 MG/ML IJ SOLN: 6.2500 mg | INTRAMUSCULAR | Status: DC | PRN

## 2021-03-21 MED ORDER — EPHEDRINE 5 MG/ML INJ
INTRAVENOUS | Status: AC
  Filled 2021-03-21: qty 5

## 2021-03-21 MED ORDER — OXYCODONE HCL 5 MG PO TABS: 5.0000 mg | ORAL_TABLET | Freq: Once | ORAL | Status: DC | PRN

## 2021-03-21 MED ORDER — LIDOCAINE HCL (PF) 2 % IJ SOLN
INTRAMUSCULAR | Status: AC
  Filled 2021-03-21: qty 5

## 2021-03-21 MED ORDER — VASOPRESSIN 20 UNIT/ML IV SOLN
INTRAVENOUS | Status: DC | PRN
  Administered 2021-03-21 (×2): 1 [IU] via INTRAVENOUS

## 2021-03-21 MED ORDER — FENTANYL CITRATE (PF) 100 MCG/2ML IJ SOLN: 25.0000 ug | INTRAMUSCULAR | Status: DC | PRN

## 2021-03-21 MED ORDER — DEXAMETHASONE SODIUM PHOSPHATE 10 MG/ML IJ SOLN
INTRAMUSCULAR | Status: AC
  Filled 2021-03-21: qty 1

## 2021-03-21 MED ORDER — PROPOFOL 500 MG/50ML IV EMUL
INTRAVENOUS | Status: AC
  Filled 2021-03-21: qty 50

## 2021-03-21 MED ORDER — BUPIVACAINE HCL (PF) 0.5 % IJ SOLN
INTRAMUSCULAR | Status: AC
  Filled 2021-03-21: qty 30

## 2021-03-21 MED ORDER — FENTANYL CITRATE (PF) 100 MCG/2ML IJ SOLN
INTRAMUSCULAR | Status: DC | PRN
  Administered 2021-03-21 (×3): 50 ug via INTRAVENOUS

## 2021-03-21 MED ORDER — CHLORHEXIDINE GLUCONATE 0.12 % MT SOLN: 15.0000 mL | Freq: Once | OROMUCOSAL | Status: AC

## 2021-03-21 MED ORDER — FLUORESCEIN SODIUM 10 % IV SOLN
INTRAVENOUS | Status: AC
  Filled 2021-03-21: qty 5

## 2021-03-21 MED ORDER — OXYCODONE HCL 5 MG/5ML PO SOLN
5.0000 mg | Freq: Once | ORAL | Status: DC | PRN
Start: 2021-03-21 — End: 2021-03-21

## 2021-03-21 MED ORDER — ONDANSETRON HCL 4 MG/2ML IJ SOLN
INTRAMUSCULAR | Status: DC | PRN
  Administered 2021-03-21: 4 mg via INTRAVENOUS

## 2021-03-21 MED ORDER — PROPOFOL 500 MG/50ML IV EMUL
INTRAVENOUS | Status: AC
  Filled 2021-03-21: qty 100

## 2021-03-21 MED ORDER — GABAPENTIN 300 MG PO CAPS
ORAL_CAPSULE | ORAL | Status: AC
  Administered 2021-03-21: 300 mg via ORAL
  Filled 2021-03-21: qty 1

## 2021-03-21 MED ORDER — ORAL CARE MOUTH RINSE: 15.0000 mL | Freq: Once | OROMUCOSAL | Status: AC

## 2021-03-21 MED ORDER — OXYCODONE HCL 5 MG PO TABS: 5.0000 mg | ORAL_TABLET | ORAL | 0 refills | Status: AC | PRN

## 2021-03-21 MED ORDER — DOCUSATE SODIUM 100 MG PO CAPS: 100.0000 mg | ORAL_CAPSULE | Freq: Two times a day (BID) | ORAL | 0 refills | Status: AC

## 2021-03-21 MED ORDER — LACTATED RINGERS IV SOLN: INTRAVENOUS | Status: DC

## 2021-03-21 MED ORDER — ROCURONIUM BROMIDE 100 MG/10ML IV SOLN
INTRAVENOUS | Status: DC | PRN
  Administered 2021-03-21: 50 mg via INTRAVENOUS
  Administered 2021-03-21 (×2): 30 mg via INTRAVENOUS
  Administered 2021-03-21: 20 mg via INTRAVENOUS

## 2021-03-21 MED ORDER — PROPOFOL 10 MG/ML IV BOLUS
INTRAVENOUS | Status: AC
  Filled 2021-03-21: qty 20

## 2021-03-21 SURGICAL SUPPLY — 68 items
ADH SKN CLS APL DERMABOND .7 (GAUZE/BANDAGES/DRESSINGS) ×2
APL PRP STRL LF DISP 70% ISPRP (MISCELLANEOUS) ×2
BAG DRN RND TRDRP ANRFLXCHMBR (UROLOGICAL SUPPLIES) ×2
BAG URINE DRAIN 2000ML AR STRL (UROLOGICAL SUPPLIES) ×3 IMPLANT
BLADE SURG SZ11 CARB STEEL (BLADE) ×3 IMPLANT
CANNULA CAP OBTURATR AIRSEAL 8 (CAP) ×3 IMPLANT
CATH FOLEY 2WAY  5CC 16FR (CATHETERS) ×1
CATH FOLEY 2WAY 5CC 16FR (CATHETERS) ×2
CATH URTH 16FR FL 2W BLN LF (CATHETERS) ×2 IMPLANT
CHLORAPREP W/TINT 26 (MISCELLANEOUS) ×3 IMPLANT
COVER TIP SHEARS 8 DVNC (MISCELLANEOUS) ×2 IMPLANT
COVER TIP SHEARS 8MM DA VINCI (MISCELLANEOUS) ×1
DEFOGGER SCOPE WARMER CLEARIFY (MISCELLANEOUS) ×3 IMPLANT
DERMABOND ADVANCED (GAUZE/BANDAGES/DRESSINGS) ×1
DERMABOND ADVANCED .7 DNX12 (GAUZE/BANDAGES/DRESSINGS) ×2 IMPLANT
DRAPE 3/4 80X56 (DRAPES) ×6 IMPLANT
DRAPE ARM DVNC X/XI (DISPOSABLE) ×8 IMPLANT
DRAPE COLUMN DVNC XI (DISPOSABLE) ×2 IMPLANT
DRAPE DA VINCI XI ARM (DISPOSABLE) ×4
DRAPE DA VINCI XI COLUMN (DISPOSABLE) ×1
ELECT REM PT RETURN 9FT ADLT (ELECTROSURGICAL) ×3
ELECTRODE REM PT RTRN 9FT ADLT (ELECTROSURGICAL) ×2 IMPLANT
GAUZE 4X4 16PLY ~~LOC~~+RFID DBL (SPONGE) ×4 IMPLANT
GLOVE SURG ENC MOIS LTX SZ7 (GLOVE) ×12 IMPLANT
GLOVE SURG UNDER LTX SZ7.5 (GLOVE) ×12 IMPLANT
GOWN STRL REUS W/ TWL LRG LVL3 (GOWN DISPOSABLE) ×16 IMPLANT
GOWN STRL REUS W/TWL LRG LVL3 (GOWN DISPOSABLE) ×24
GRASPER SUT TROCAR 14GX15 (MISCELLANEOUS) ×3 IMPLANT
HEMOSTAT ARISTA ABSORB 1G (HEMOSTASIS) ×1 IMPLANT
IRRIGATION STRYKERFLOW (MISCELLANEOUS) IMPLANT
IRRIGATOR STRYKERFLOW (MISCELLANEOUS)
IV NS 1000ML (IV SOLUTION) ×12
IV NS 1000ML BAXH (IV SOLUTION) IMPLANT
KIT PINK PAD W/HEAD ARE REST (MISCELLANEOUS) ×3
KIT PINK PAD W/HEAD ARM REST (MISCELLANEOUS) ×2 IMPLANT
KIT TURNOVER CYSTO (KITS) ×3 IMPLANT
LABEL OR SOLS (LABEL) ×3 IMPLANT
MANIFOLD NEPTUNE II (INSTRUMENTS) ×3 IMPLANT
MANIPULATOR VCARE LG CRV RETR (MISCELLANEOUS) IMPLANT
MANIPULATOR VCARE SML CRV RETR (MISCELLANEOUS) IMPLANT
MANIPULATOR VCARE STD CRV RETR (MISCELLANEOUS) ×1 IMPLANT
NS IRRIG 1000ML POUR BTL (IV SOLUTION) ×2 IMPLANT
OBTURATOR OPTICAL STANDARD 8MM (TROCAR) ×1
OBTURATOR OPTICAL STND 8 DVNC (TROCAR) ×2
OBTURATOR OPTICALSTD 8 DVNC (TROCAR) ×2 IMPLANT
OCCLUDER COLPOPNEUMO (BALLOONS) ×3 IMPLANT
PACK GYN LAPAROSCOPIC (MISCELLANEOUS) ×3 IMPLANT
PAD OB MATERNITY 4.3X12.25 (PERSONAL CARE ITEMS) ×3 IMPLANT
PAD PREP 24X41 OB/GYN DISP (PERSONAL CARE ITEMS) ×3 IMPLANT
SCISSORS METZENBAUM CVD 33 (INSTRUMENTS) IMPLANT
SCRUB EXIDINE 4% CHG 4OZ (MISCELLANEOUS) ×3 IMPLANT
SEAL CANN UNIV 5-8 DVNC XI (MISCELLANEOUS) ×6 IMPLANT
SEAL XI 5MM-8MM UNIVERSAL (MISCELLANEOUS) ×3
SEALER VESSEL DA VINCI XI (MISCELLANEOUS) ×1
SEALER VESSEL EXT DVNC XI (MISCELLANEOUS) ×2 IMPLANT
SET CYSTO W/LG BORE CLAMP LF (SET/KITS/TRAYS/PACK) ×2 IMPLANT
SET TUBE FILTERED XL AIRSEAL (SET/KITS/TRAYS/PACK) ×3 IMPLANT
SOLUTION ELECTROLUBE (MISCELLANEOUS) ×3 IMPLANT
SURGILUBE 2OZ TUBE FLIPTOP (MISCELLANEOUS) ×3 IMPLANT
SUT MNCRL 4-0 (SUTURE) ×3
SUT MNCRL 4-0 27XMFL (SUTURE) ×2
SUT VIC AB 0 CT2 27 (SUTURE) ×5 IMPLANT
SUT VIC AB 2-0 SH 27 (SUTURE) ×3
SUT VIC AB 2-0 SH 27XBRD (SUTURE) IMPLANT
SUT VLOC 90 2/L VL 12 GS22 (SUTURE) ×3 IMPLANT
SUTURE MNCRL 4-0 27XMF (SUTURE) ×2 IMPLANT
SYR 50ML LL SCALE MARK (SYRINGE) ×1 IMPLANT
WATER STERILE IRR 500ML POUR (IV SOLUTION) ×3 IMPLANT

## 2021-03-21 NOTE — Anesthesia Procedure Notes (Signed)
Procedure Name: Intubation Date/Time: 03/21/2021 7:45 AM Performed by: Gilford Raid, CRNA Pre-anesthesia Checklist: Patient identified, Patient being monitored, Timeout performed, Emergency Drugs available and Suction available Patient Re-evaluated:Patient Re-evaluated prior to induction Oxygen Delivery Method: Circle system utilized Preoxygenation: Pre-oxygenation with 100% oxygen Induction Type: IV induction Ventilation: Mask ventilation without difficulty Laryngoscope Size: 2 Grade View: Grade I Tube type: Oral Tube size: 7.0 mm Number of attempts: 1 Placement Confirmation: ETT inserted through vocal cords under direct vision, positive ETCO2 and breath sounds checked- equal and bilateral Secured at: 21 cm Tube secured with: Tape Dental Injury: Teeth and Oropharynx as per pre-operative assessment

## 2021-03-21 NOTE — Transfer of Care (Signed)
Immediate Anesthesia Transfer of Care Note  Patient: Andrea Livingston  Procedure(s) Performed: XI ROBOTIC ASSISTED TOTAL HYSTERECTOMY WITH BILATERAL SALPINGO OOPHORECTOMY (Bilateral) CYSTOSCOPY  Patient Location: PACU  Anesthesia Type:General  Level of Consciousness: awake, alert  and oriented  Airway & Oxygen Therapy: Patient Spontanous Breathing and Patient connected to face mask oxygen  Post-op Assessment: Report given to RN and Post -op Vital signs reviewed and stable  Post vital signs: Reviewed and stable  Last Vitals:  Vitals Value Taken Time  BP 92/55   Temp    Pulse 65 03/21/21 1118  Resp 9 03/21/21 1118  SpO2 100 % 03/21/21 1118  Vitals shown include unvalidated device data.  Last Pain:  Vitals:   03/21/21 0622  TempSrc: Tympanic  PainSc: 0-No pain         Complications: No notable events documented.

## 2021-03-21 NOTE — Discharge Instructions (Addendum)
AMBULATORY SURGERY  DISCHARGE INSTRUCTIONS   The drugs that you were given will stay in your system until tomorrow so for the next 24 hours you should not:  Drive an automobile Make any legal decisions Drink any alcoholic beverage   You may resume regular meals tomorrow.  Today it is better to start with liquids and gradually work up to solid foods.  You may eat anything you prefer, but it is better to start with liquids, then soup and crackers, and gradually work up to solid foods.   Please notify your doctor immediately if you have any unusual bleeding, trouble breathing, redness and pain at the surgery site, drainage, fever, or pain not relieved by medication.      Please contact your physician with any problems or Same Day Surgery at [phone], Monday through Friday 6 am to 4 pm, or [place] [facility] [place] number at [phone].   Discharge instructions after  robotically-assisted total laparoscopic hysterectomy   For the next three days, take ibuprofen and acetaminophen on a schedule, every 8 hours. You can take them together or you can intersperse them, and take one every four hours. I also gave you gabapentin for nighttime, to help you sleep and also to control pain. Take gabapentin medicines at night for at least the next 3 nights. You also have a narcotic, oxycodone, to take as needed if the above medicines don't help.  Postop constipation is a major cause of pain. Stay well hydrated, walk as you tolerate, and take over the counter senna as well as stool softeners if you need them.   Signs and Symptoms to Report Call our office at [phone] if you have any of the following.   Fever over 100.4 degrees or higher  Severe stomach pain not relieved with pain medications  Bright red bleeding that's heavier than a period that does not slow with rest  To go the bathroom a lot (frequency), you can't hold your urine (urgency), or it hurts when you empty your  bladder (urinate)  Chest pain  Shortness of breath  Pain in the calves of your legs  Severe nausea and vomiting not relieved with anti-nausea medications  Signs of infection around your wounds, such as redness, hot to touch, swelling, green/yellow drainage (like pus), bad smelling discharge  Any concerns  What You Can Expect after Surgery  You may see some pink tinged, bloody fluid and bruising around the wound. This is normal.  You may notice shoulder and neck pain. This is caused by the gas used during surgery to expand your abdomen so your surgeon could get to the uterus easier.  You may have a sore throat because of the tube in your mouth during general anesthesia. This will go away in 2 to 3 days.  You may have some stomach cramps.  You may notice spotting on your panties.  You may have pain around the incision sites.   Activities after Your Discharge Follow these guidelines to help speed your recovery at home:  Do the coughing and deep breathing as you did in the hospital for 2 weeks. Use the small blue breathing device, called the incentive spirometer for 2 weeks.  Don't drive if you are in pain or taking narcotic pain medicine. You may drive when you can safely slam on the brakes, turn the wheel forcefully, and rotate your torso comfortably. This is typically 1-2 weeks. Practice in a parking lot or side street prior to attempting to drive  regularly.   Ask others to help with household chores for 4 weeks.  Do not lift anything heavier that 10 pounds for 4-6 weeks. This includes pets, children, and groceries.  Don't do strenuous activities, exercises, or sports like vacuuming, tennis, squash, etc. until your doctor says it is safe to do so. ---Maintain pelvic rest for 12 weeks. This means nothing in the vagina or rectum at all (no douching, tampons, intercourse) for 12 weeks.   Walk as you feel able. Rest often since it may take two or three weeks for your energy level to return to  normal.   You may climb stairs  Avoid constipation:   -Eat fruits, vegetables, and whole grains. Eat small meals as your appetite will take time to return to normal.   -Drink 6 to 8 glasses of water each day unless your doctor has told you to limit your fluids.   -Use a laxative or stool softener as needed if constipation becomes a problem. You may take Miralax, metamucil, Citrucil, Colace, Senekot, FiberCon, etc. If this does not relieve the constipation, try two tablespoons of Milk Of Magnesia every 8 hours until your bowels move.   You may shower. Gently wash the wounds with a mild soap and water. Pat dry.  Do not get in a hot tub, swimming pool, etc. for 6 weeks.  Do not use lotions, oils, powders on the wounds.  Do not douche, use tampons, or have sex until your doctor says it is okay.  Take your pain medicine when you need it. The medicine may not work as well if the pain is bad.  Take the medicines you were taking before surgery. Other medications you will need are pain medications (Norco or Percocet) and nausea medications (Zofran).   Here is a helpful article from the website http://mitchell.org/, regarding constipation  Here are reasons why constipation occurs after surgery: 1) During the operation and in the recovery room, most people are given opioid pain medication, primarily through an IV, to treat moderate or severe pain. Intravenous opioids include morphine, Dilaudid and fentanyl. After surgery, patients are often prescribed opioid pain medication to take by mouth at home, including codeine, Vicodin, Norco, and Percocet. All of these medications cause constipation by slowing down the movement of your intestine. 2) Changes in your diet before surgery can be another culprit. It is common to get specific instructions to change how you normally eat or drink before your surgery, like only having liquids the day before or not having anything to eat or drink after midnight the night before  surgery. For this reason, temporary dehydration may occur. This, along with not eating or only having liquids, means that you are getting less fiber than usual. Both these factors contribute to constipation. 3) Changes in your diet after surgery can also contribute to the problem. Although many people don't have dietary restrictions after operations, being under anesthesia can make you lose your appetite for several hours and maybe even days. Some people can even have nausea or vomiting. Not eating or drinking normally means that you are not getting enough fiber and you can get dehydrated, both leading to constipation. 4) Lying in a bed more than usual--which happens before, during and after surgery--combined with the medications and diet changes, all work together to slow down your colon and make your poop turn to rock.  No one likes to be constipated.  Let's face it, it's not a pleasant feeling when you don't poop for days, then  strain on the toilet to finally pass something large enough to cause damage. An ounce of prevention is worth a pound of cure, so: Assume you will be constipated. Plan and prepare accordingly. Post-surgery is one of those unique situations where the temporary use of laxatives can make a world of difference. Always consult with your doctor, and recognize that if you wait several days after surgery to take a laxative, the constipation might be too severe for these over-the-counter options. It is always important to discuss all medications you plan on taking with your doctor. Ask your doctor if you can start the laxative immediately after surgery. *  Here are go-to post-surgery laxatives: Senna: Senna is an herb that acts as a "stimulant laxative," meaning it increases the activity of the intestine to cause you to have a bowel movement. It comes in many forms, but senna pills are easy to take and are sold over the counter at almost all pharmacies. Since opioid pain medications slow  down the activity of the intestine, it makes sense to take a medication to help reverse that side effect. Long-term use of a stimulant laxative is not a good idea since it can make your colon "lazy" and not function properly; however, temporary use immediately after surgery is acceptable. In general, if you are able to eat a normal diet, taking senna soon after surgery works the best. Senna usually works within hours to produce a bowel movement, but this is less predictable when you are taking different medications after surgery. Try not to wait several days to start taking senna, as often it is too late by then. Just like with all medications or supplements, check with your doctor before starting new treatment.   Magnesium: Magnesium is an important mineral that our body needs. We get magnesium from some foods that we eat, especially foods that are high in fiber such as broccoli, almonds and whole grains. There are also magnesium-based medications used to treat constipation including milk of magnesia (magnesium hydroxide), magnesium citrate and magnesium oxide. They work by drawing water into the intestine, putting it into the class of "osmotic" laxatives. Magnesium products in low doses appear to be safe, but if taken in very large doses, can lead to problems such as irregular heartbeat, low blood pressure and even death. It can also affect other medications you might be taking, therefore it is important to discuss using magnesium with your physician and pharmacist before initiating therapy. Most over-the-counter magnesium laxatives work very well to help with the constipation related to surgery, but sometimes they work too well and lead to diarrhea. Make sure you are somewhere with easy access to a bathroom, just in case.   Bisacodyl: Bisacodyl (generic name) is sold under brand names such as Dulcolax. Much like senna, it is a "stimulant laxative," meaning it makes your intestines move more quickly to push  out the stool. This is another good choice to start taking as soon as your doctor says you can take a laxative after surgery. It comes in pill form and as a suppository, which is a good choice for people who cannot or are not allowed to swallow pills. Studies have shown that it works as a laxative, but like most of these medications, you should use this on a short-term basis only.   Enema: Enemas strike fear in many people, but FEAR NOT! It's nowhere near as big a deal as you may think. An enema is just a way to get some liquid into  your rectum by placing a specially designed device through your anus. If you have never done one, it might seem like a painful, unpleasant, uncomfortable, complicated and lengthy procedure. But in reality, it's simple, takes just a few seconds and is highly effective. The small ready-made bottles you buy at the pharmacy are much easier than the hose/large rubber container type. Those recommended positions illustrated in some instructions are generally not necessary to place the enema. It's very similar to the insertion of a tampon, requiring a slight squat. Some extra lubrication on the enema's tip (or on your anus) will make it a breeze. In certain cases, there is no substitute for a good enema. For example, if someone has not pooped for a few days, the beginning of the poop waiting to come out can become rock hard. Passing that hard stool can lead to much pain and problems like anal fissures. Inserting a little liquid to break up the rock-hard stool will help make its passage much easier. Enemas come with different liquids. Most come with saline, but there are also mineral oil options. You can also use warm water in the reusable enema containers. They all work. But since saline can sometimes be irritating, so try a mineral oil or water enema instead.  Here are commonly recommended constipation medications that do not work well for post-surgery constipation: Docusate: Docusate  (generic name) most commonly referred to as Colace (brand name) is not really a laxative, but is classified as a stool softener. Although this medication is commonly prescribed, it is not recommended for several reasons: 1) there is no good medical evidence that it works 2) even if it has an effect, which is very questionable, it is minimal and cannot combat the intestinal slowing caused by the opioid medications. Skip docusate to save money and space in your pillbox for something more effective.  PEG: Miralax (brand name) is basically a chemical called polyethylene glycol (PEG) and it has gained tremendous popularity as a laxative. This product is an "osmotic laxative" meaning it works by pulling water into the stool, making it softer. This is very similar to the action of natural fiber in foods and supplements. Therefore, the effect seen by this medication is not immediate, causing a bowel movement in a day or more. Is this medication strong enough to battle the constipation related to having an operation? Maybe for some people not prone to constipation. But for most people, other laxatives are better to prevent constipation after surgery.

## 2021-03-21 NOTE — Anesthesia Preprocedure Evaluation (Signed)
Anesthesia Evaluation  Patient identified by MRN, date of birth, ID band Patient awake    Reviewed: Allergy & Precautions, NPO status , Patient's Chart, lab work & pertinent test results  History of Anesthesia Complications (+) PONV and history of anesthetic complications  Airway Mallampati: II  TM Distance: >3 FB Neck ROM: Full    Dental no notable dental hx.    Pulmonary neg pulmonary ROS, neg sleep apnea, neg COPD,    breath sounds clear to auscultation- rhonchi (-) wheezing      Cardiovascular hypertension, Pt. on medications (-) CAD, (-) Past MI, (-) Cardiac Stents and (-) CABG  Rhythm:Regular Rate:Normal - Systolic murmurs and - Diastolic murmurs    Neuro/Psych neg Seizures PSYCHIATRIC DISORDERS Depression negative neurological ROS     GI/Hepatic Neg liver ROS, GERD  ,  Endo/Other  negative endocrine ROSneg diabetes  Renal/GU negative Renal ROS     Musculoskeletal  (+) Arthritis ,   Abdominal (+) + obese,   Peds  Hematology negative hematology ROS (+)   Anesthesia Other Findings Past Medical History: No date: Arthritis     Comment:  rheumatoid No date: Barrett esophagus No date: Depression No date: GERD (gastroesophageal reflux disease) No date: Hypertension No date: Motion sickness No date: PONV (postoperative nausea and vomiting)   Reproductive/Obstetrics                             Anesthesia Physical Anesthesia Plan  ASA: 2  Anesthesia Plan: General   Post-op Pain Management:    Induction: Intravenous  PONV Risk Score and Plan: 3 and Ondansetron, Dexamethasone and TIVA  Airway Management Planned: Oral ETT  Additional Equipment:   Intra-op Plan:   Post-operative Plan: Extubation in OR  Informed Consent: I have reviewed the patients History and Physical, chart, labs and discussed the procedure including the risks, benefits and alternatives for the proposed  anesthesia with the patient or authorized representative who has indicated his/her understanding and acceptance.     Dental advisory given  Plan Discussed with: CRNA and Anesthesiologist  Anesthesia Plan Comments:         Anesthesia Quick Evaluation

## 2021-03-21 NOTE — Op Note (Signed)
Date of procedure: 03/21/21  Preoperative diagnosis:  Postmenopausal bleeding  Postoperative diagnosis:  Same  Procedure: Cystoscopy  Surgeon: Legrand Rams, MD  Anesthesia: General  Complications: None  Intraoperative findings:  Excellent efflux from ureters bilaterally, bladder grossly normal with no abnormalities  EBL: None for urology portion  Specimens: None for urology portion  Drains: None for urology portion  Indication: Andrea Livingston is a 65 y.o. patient who underwent a robotic hysterectomy with Dr. Dalbert Garnet, and at the conclusion of the case her cystoscopy was concerning for possible decreased efflux from the ureters bilaterally, and low urine output in the catheter.  Urology was requested for cystoscopy intraoperatively.  Description of procedure: On my arrival, the patient was prepped and draped in standard fashion, and the robotic port sites had already been closed.  She was prepped with Betadine and a 21 French rigid cystoscope was used to intubate the urethra and thorough cystoscopy was performed.  The bladder was full of limegreen urine from prior IV dye.  This was irrigated free and thorough cystoscopy was performed.  The bladder was grossly normal throughout, and the ureteral orifices were orthotopic bilaterally.  No suspicious lesions.  There was excellent efflux of urine from both ureters under direct vision.  The bladder was left partially full at the request of the gynecology team, and no Foley was placed.   Disposition: Stable to PACU  Plan: No urology follow-up needed  Legrand Rams, MD

## 2021-03-21 NOTE — Anesthesia Postprocedure Evaluation (Signed)
Anesthesia Post Note  Patient: Andrea Livingston  Procedure(s) Performed: XI ROBOTIC ASSISTED TOTAL HYSTERECTOMY WITH BILATERAL SALPINGO OOPHORECTOMY (Bilateral) CYSTOSCOPY  Patient location during evaluation: PACU Anesthesia Type: General Level of consciousness: awake and alert and oriented Pain management: pain level controlled Vital Signs Assessment: post-procedure vital signs reviewed and stable Respiratory status: spontaneous breathing, nonlabored ventilation and respiratory function stable Cardiovascular status: blood pressure returned to baseline and stable Postop Assessment: no signs of nausea or vomiting Anesthetic complications: no   No notable events documented.   Last Vitals:  Vitals:   03/21/21 1143 03/21/21 1155  BP: 106/63 104/60  Pulse: 67 (!) 59  Resp: 14 16  Temp: (!) 36.1 C   SpO2: 96% 100%    Last Pain:  Vitals:   03/21/21 1155  TempSrc:   PainSc: 0-No pain                 Tashan Kreitzer

## 2021-03-21 NOTE — Op Note (Signed)
[facility] PROCEDURE DATE: 03/21/2021  PREOPERATIVE DIAGNOSIS: Persistent postmenopausal bleeding POSTOPERATIVE DIAGNOSIS: The same PROCEDURE:  XI ROBOTIC ASSISTED TOTAL HYSTERECTOMY WITH BILATERAL SALPINGO OOPHORECTOMY:  CYSTOSCOPY: 52000 (CPT)  SURGEON:  Dr. Christeen Douglas, MD ASSISTANT: CST Anesthesiologist:  Anesthesiologist: Alver Fisher, MD CRNA: Gilford Raid, CRNA; Joanette Gula Summer, CRNA  INDICATIONS: 65 y.o. F here for definitive surgical management secondary to the indications listed under preoperative diagnoses; please see preoperative note for further details.  Risks of surgery were discussed with the patient including but not limited to: bleeding which may require transfusion or reoperation; infection which may require antibiotics; injury to bowel, bladder, ureters or other surrounding organs; need for additional procedures; thromboembolic phenomenon, incisional problems and other postoperative/anesthesia complications. Written informed consent was obtained.    FINDINGS:   Pelvic: External genitalia negative for lesions. Vagina negative. Adnexa negative for masses or nodularity. Cervix without gross lesions. Uterus mobile, anteverted, small.  Intraoperative findings revealed a normal upper abdomen including bowel, diaphragmatic surfaces, stomach, and omentum.  The uterus was small and mobile.  The right and left ovaries appeared normal.  Bilateral tubes appeared normal. At the close of the case, I added a cystoscopy as I could not see peristalsis on the ureters bilaterally. After visualization of efflux, the foley cath was found to have less than 5cc of urine for the entire case. It was confirmed patent, and I asked urology to evaluate the bladder and ureters for injury. No concerns were found.   ANESTHESIA:    General INTRAVENOUS FLUIDS:1100  ml ESTIMATED BLOOD LOSS:20 ml URINE OUTPUT: minimal  SPECIMENS: Uterus, cervix, bilateral fallopian tubes and  bilateral ovaries COMPLICATIONS: None immediate  RATLH/BSO:  PROCEDURE IN DETAIL: After informed consent was obtained, the patient was taken to the operating room where general anesthesia was obtained without difficulty. The patient was positioned in the dorsal lithotomy position in [place] stirrups and her arms were carefully tucked at her sides and the usual precautions were taken. Deep Trendelenburg (20-25 deg) was established to confirm that she does not shift on the table.  She was prepped and draped in normal sterile fashion.  Time-out was performed and a Foley catheter was placed into the bladder. A standard VCare uterine manipulator was then placed in the uterus without incident.  Preoperative prophylactic antibiotics were given through her iv.  After infiltration of local anesthetic at the proposed trocar sites, an 8 mm incision was created at the umbilicus, and an AirSeal 66mm was placed under direct visualization, after confirmation of OG tube working well. Pneumoperitoneum was created to a pressure of 15 mm Hg. The camera was placed and the abdomin surveyed, noting intact bowel below the site of entry. A survey of the pelvis and upper abdomen revealed the above findings. Right and left lateral 8-mm robotic ports were placed under direct visualization.  The patient was placed in deepTrendelenburg and the bowel was displaced up into the upper abdomen. The robot was left side docked. The instruments were placed under direct visualization.   The ureters were identified bilaterally coursing outside of the operative field. Round ligaments were divided on each side with the EndoShears and the retroperitoneal space was opened bilaterally. The posterior leaflet of the broad was taken down to the level of the IP ligament. The anterior leaflet of the broad ligament was carefully taken down to the midline.  A bladder flap was created and the bladder was dissected down off the lower uterine segment and  cervix using endoshears and electrocautery.  Ovariolysis was performed and the ovary was dissected medially with care to avoid the ureter.  The infundibulopelvic ligaments were skeletonized, sealed and divided. The peritoneum was taken down to the level of the internal os, and the uterine arteries skeletonized. With strong cephalad pressure from the V-care, bipolar cautery was used to seal and transect the uterine arteries, and the pedicles allowed to fall away laterally.  A colpotomy was performed circumferentially along the V-Care ring with monopolar electrocautery and the cervix was incised from the vagina using the laparoscopic scissors. The specimen was removed through the vagina.  A pneumo balloon was placed in the vagina and the vaginal cuff was then closed in a running continuous fashion using the  0 V-Lock suture with careful attention to include the vaginal cuff angles, the uterosacral ligaments and the vaginal mucosa within the closure.  A figure of eight using 2-0 vicryl placed at the bladder above the cervix to oversew a possible area of cautery near the bladder.  Hemostasis was secured with intraabdominal pressure and review of all surgical sites. The intraperitoneal pressure was dropped, and all planes of dissection, vascular pedicles and the vaginal cuff were found to be hemostatic.  1g arista placed over the cuff. The robot was undocked. The lateral trocars were removed under visualization.  The CO2 gas was released and several deep breaths given to remove any remaining CO2 from the peritoneal cavity.  The skin incisions were closed with 4-0 Monocryl subcuticular stitch and Dermabond.   Cystoscopy confirmed bladder integrity and ureter with excellent efflux with fluorescein. Urology present for a full urology on confirm as well.   Anesthesia was reversed without difficulty.  The patient tolerated the procedure well.  Sponge, lap and needle counts were correct x2.  The patient was  taken to recovery room in excellent condition.

## 2021-03-21 NOTE — Interval H&P Note (Signed)
History and Physical Interval Note:  03/21/2021 7:28 AM  Andrea Livingston  has presented today for surgery, with the diagnosis of postmenopausal bleeding.  The various methods of treatment have been discussed with the patient and family. After consideration of risks, benefits and other options for treatment, the patient has consented to  Procedure(s): XI ROBOTIC ASSISTED TOTAL HYSTERECTOMY WITH BILATERAL SALPINGO OOPHORECTOMY (Bilateral) as a surgical intervention.  The patient's history has been reviewed, patient examined, no change in status, stable for surgery.  I have reviewed the patient's chart and labs.  Questions were answered to the patient's satisfaction.     Christeen Douglas

## 2021-03-25 LAB — SURGICAL PATHOLOGY

## 2021-07-08 DIAGNOSIS — M7542 Impingement syndrome of left shoulder: Secondary | ICD-10-CM | POA: Diagnosis not present

## 2021-07-08 DIAGNOSIS — M19012 Primary osteoarthritis, left shoulder: Secondary | ICD-10-CM | POA: Diagnosis not present

## 2021-07-08 DIAGNOSIS — M75102 Unspecified rotator cuff tear or rupture of left shoulder, not specified as traumatic: Secondary | ICD-10-CM | POA: Diagnosis not present

## 2021-07-08 DIAGNOSIS — M25512 Pain in left shoulder: Secondary | ICD-10-CM | POA: Diagnosis not present

## 2021-07-21 DIAGNOSIS — H52212 Irregular astigmatism, left eye: Secondary | ICD-10-CM | POA: Diagnosis not present

## 2021-09-03 DIAGNOSIS — E7849 Other hyperlipidemia: Secondary | ICD-10-CM | POA: Diagnosis not present

## 2021-09-03 DIAGNOSIS — I10 Essential (primary) hypertension: Secondary | ICD-10-CM | POA: Diagnosis not present

## 2021-09-03 DIAGNOSIS — K2270 Barrett's esophagus without dysplasia: Secondary | ICD-10-CM | POA: Diagnosis not present

## 2021-09-03 DIAGNOSIS — Z0000 Encounter for general adult medical examination without abnormal findings: Secondary | ICD-10-CM | POA: Diagnosis not present

## 2021-11-19 ENCOUNTER — Other Ambulatory Visit: Payer: Self-pay | Admitting: Internal Medicine

## 2021-11-19 DIAGNOSIS — Z1231 Encounter for screening mammogram for malignant neoplasm of breast: Secondary | ICD-10-CM

## 2021-12-11 ENCOUNTER — Ambulatory Visit
Admission: RE | Admit: 2021-12-11 | Discharge: 2021-12-11 | Disposition: A | Discharge status: Home / Self Care | Payer: Medicare Other | Source: Ambulatory Visit | Attending: Internal Medicine | Admitting: Internal Medicine

## 2021-12-11 DIAGNOSIS — Z1231 Encounter for screening mammogram for malignant neoplasm of breast: Secondary | ICD-10-CM | POA: Insufficient documentation

## 2022-09-28 ENCOUNTER — Other Ambulatory Visit: Payer: Self-pay | Admitting: Internal Medicine

## 2022-09-28 DIAGNOSIS — I10 Essential (primary) hypertension: Secondary | ICD-10-CM

## 2022-09-28 DIAGNOSIS — E7849 Other hyperlipidemia: Secondary | ICD-10-CM

## 2022-10-14 ENCOUNTER — Ambulatory Visit
Admission: RE | Admit: 2022-10-14 | Discharge: 2022-10-14 | Disposition: A | Discharge status: Home / Self Care | Payer: Medicare Other | Source: Ambulatory Visit | Attending: Internal Medicine | Admitting: Internal Medicine

## 2022-10-14 DIAGNOSIS — E7849 Other hyperlipidemia: Secondary | ICD-10-CM | POA: Insufficient documentation

## 2022-10-14 DIAGNOSIS — I10 Essential (primary) hypertension: Secondary | ICD-10-CM | POA: Insufficient documentation

## 2023-03-08 ENCOUNTER — Other Ambulatory Visit: Payer: Self-pay | Admitting: Internal Medicine

## 2023-03-08 DIAGNOSIS — Z1231 Encounter for screening mammogram for malignant neoplasm of breast: Secondary | ICD-10-CM

## 2023-03-12 ENCOUNTER — Ambulatory Visit
Admission: RE | Admit: 2023-03-12 | Discharge: 2023-03-12 | Disposition: A | Discharge status: Home / Self Care | Payer: Medicare Other | Source: Ambulatory Visit | Attending: Internal Medicine | Admitting: Internal Medicine

## 2023-03-12 DIAGNOSIS — Z1231 Encounter for screening mammogram for malignant neoplasm of breast: Secondary | ICD-10-CM | POA: Diagnosis present

## 2024-02-25 ENCOUNTER — Other Ambulatory Visit: Payer: Self-pay | Admitting: Internal Medicine

## 2024-02-25 DIAGNOSIS — Z1231 Encounter for screening mammogram for malignant neoplasm of breast: Secondary | ICD-10-CM

## 2024-03-24 ENCOUNTER — Ambulatory Visit
Admission: RE | Admit: 2024-03-24 | Discharge: 2024-03-24 | Disposition: A | Discharge status: Home / Self Care | Source: Ambulatory Visit | Attending: Internal Medicine | Admitting: Internal Medicine

## 2024-03-24 DIAGNOSIS — Z1231 Encounter for screening mammogram for malignant neoplasm of breast: Secondary | ICD-10-CM | POA: Diagnosis present

## 2024-05-10 ENCOUNTER — Other Ambulatory Visit: Payer: Self-pay | Admitting: Family Medicine

## 2024-05-10 DIAGNOSIS — M5416 Radiculopathy, lumbar region: Secondary | ICD-10-CM

## 2024-05-20 ENCOUNTER — Other Ambulatory Visit

## 2024-05-29 ENCOUNTER — Inpatient Hospital Stay
Admission: RE | Admit: 2024-05-29 | Discharge: 2024-05-29 | Disposition: A | Source: Ambulatory Visit | Attending: Family Medicine | Admitting: Family Medicine

## 2024-05-29 DIAGNOSIS — M5416 Radiculopathy, lumbar region: Secondary | ICD-10-CM
